# Patient Record
Sex: Female | Born: 1960 | Race: White | Hispanic: No | Marital: Married | Smoking: Never smoker
Health system: Southern US, Community
[De-identification: ages and names within clinical notes are randomized; demographics above are authoritative.]

## PROBLEM LIST (undated history)

## (undated) DIAGNOSIS — E7800 Pure hypercholesterolemia, unspecified: Secondary | ICD-10-CM

## (undated) DIAGNOSIS — S83249A Other tear of medial meniscus, current injury, unspecified knee, initial encounter: Secondary | ICD-10-CM

## (undated) DIAGNOSIS — E785 Hyperlipidemia, unspecified: Secondary | ICD-10-CM

## (undated) DIAGNOSIS — E119 Type 2 diabetes mellitus without complications: Secondary | ICD-10-CM

## (undated) DIAGNOSIS — Z8719 Personal history of other diseases of the digestive system: Secondary | ICD-10-CM

## (undated) DIAGNOSIS — E039 Hypothyroidism, unspecified: Secondary | ICD-10-CM

## (undated) DIAGNOSIS — Z87442 Personal history of urinary calculi: Secondary | ICD-10-CM

## (undated) DIAGNOSIS — I10 Essential (primary) hypertension: Secondary | ICD-10-CM

## (undated) DIAGNOSIS — Z8541 Personal history of malignant neoplasm of cervix uteri: Secondary | ICD-10-CM

## (undated) DIAGNOSIS — E669 Obesity, unspecified: Secondary | ICD-10-CM

## (undated) DIAGNOSIS — T8859XA Other complications of anesthesia, initial encounter: Secondary | ICD-10-CM

## (undated) DIAGNOSIS — K219 Gastro-esophageal reflux disease without esophagitis: Secondary | ICD-10-CM

## (undated) DIAGNOSIS — T4145XA Adverse effect of unspecified anesthetic, initial encounter: Secondary | ICD-10-CM

## (undated) DIAGNOSIS — M1990 Unspecified osteoarthritis, unspecified site: Secondary | ICD-10-CM

## (undated) HISTORY — PX: OTHER SURGICAL HISTORY: SHX169

## (undated) HISTORY — PX: ABDOMINAL HYSTERECTOMY: SHX81

## (undated) HISTORY — DX: Essential (primary) hypertension: I10

## (undated) HISTORY — PX: COLONOSCOPY: SHX174

## (undated) HISTORY — DX: Type 2 diabetes mellitus without complications: E11.9

## (undated) HISTORY — DX: Hyperlipidemia, unspecified: E78.5

---

## 1998-07-28 ENCOUNTER — Emergency Department (HOSPITAL_COMMUNITY): Admission: EM | Admit: 1998-07-28 | Discharge: 1998-07-28 | Payer: Self-pay | Admitting: Internal Medicine

## 1998-07-28 ENCOUNTER — Encounter: Payer: Self-pay | Admitting: Internal Medicine

## 2000-02-28 ENCOUNTER — Ambulatory Visit (HOSPITAL_BASED_OUTPATIENT_CLINIC_OR_DEPARTMENT_OTHER): Admission: RE | Admit: 2000-02-28 | Discharge: 2000-02-28 | Payer: Self-pay | Admitting: Orthopedic Surgery

## 2000-06-20 ENCOUNTER — Encounter (INDEPENDENT_AMBULATORY_CARE_PROVIDER_SITE_OTHER): Payer: Self-pay

## 2000-06-20 ENCOUNTER — Other Ambulatory Visit: Admission: RE | Admit: 2000-06-20 | Discharge: 2000-06-20 | Payer: Self-pay | Admitting: Obstetrics and Gynecology

## 2002-04-05 ENCOUNTER — Encounter: Payer: Self-pay | Admitting: Obstetrics and Gynecology

## 2002-04-05 ENCOUNTER — Encounter: Admission: RE | Admit: 2002-04-05 | Discharge: 2002-04-05 | Payer: Self-pay | Admitting: Obstetrics and Gynecology

## 2003-03-19 DIAGNOSIS — Z8541 Personal history of malignant neoplasm of cervix uteri: Secondary | ICD-10-CM

## 2003-03-19 HISTORY — DX: Personal history of malignant neoplasm of cervix uteri: Z85.41

## 2003-05-03 ENCOUNTER — Encounter: Admission: RE | Admit: 2003-05-03 | Discharge: 2003-05-03 | Payer: Self-pay | Admitting: Obstetrics and Gynecology

## 2004-03-06 ENCOUNTER — Ambulatory Visit (HOSPITAL_BASED_OUTPATIENT_CLINIC_OR_DEPARTMENT_OTHER): Admission: RE | Admit: 2004-03-06 | Discharge: 2004-03-06 | Payer: Self-pay | Admitting: Obstetrics and Gynecology

## 2004-03-06 ENCOUNTER — Encounter (INDEPENDENT_AMBULATORY_CARE_PROVIDER_SITE_OTHER): Payer: Self-pay | Admitting: *Deleted

## 2004-03-06 ENCOUNTER — Ambulatory Visit (HOSPITAL_COMMUNITY): Admission: RE | Admit: 2004-03-06 | Discharge: 2004-03-06 | Payer: Self-pay | Admitting: Obstetrics and Gynecology

## 2004-03-08 ENCOUNTER — Inpatient Hospital Stay (HOSPITAL_COMMUNITY): Admission: RE | Admit: 2004-03-08 | Discharge: 2004-03-10 | Payer: Self-pay | Admitting: Obstetrics and Gynecology

## 2004-03-08 ENCOUNTER — Encounter (INDEPENDENT_AMBULATORY_CARE_PROVIDER_SITE_OTHER): Payer: Self-pay | Admitting: *Deleted

## 2004-05-21 ENCOUNTER — Encounter: Admission: RE | Admit: 2004-05-21 | Discharge: 2004-05-21 | Payer: Self-pay | Admitting: Obstetrics and Gynecology

## 2004-08-18 ENCOUNTER — Emergency Department (HOSPITAL_COMMUNITY): Admission: EM | Admit: 2004-08-18 | Discharge: 2004-08-18 | Payer: Self-pay | Admitting: Emergency Medicine

## 2005-03-18 DIAGNOSIS — Z87442 Personal history of urinary calculi: Secondary | ICD-10-CM

## 2005-03-18 HISTORY — DX: Personal history of urinary calculi: Z87.442

## 2008-06-02 ENCOUNTER — Emergency Department (HOSPITAL_COMMUNITY): Admission: EM | Admit: 2008-06-02 | Discharge: 2008-06-02 | Payer: Self-pay | Admitting: Emergency Medicine

## 2008-12-29 HISTORY — PX: ENDOSCOPIC PLANTAR FASCIOTOMY: SUR443

## 2010-06-28 LAB — URINALYSIS, ROUTINE W REFLEX MICROSCOPIC
Leukocytes, UA: NEGATIVE
Nitrite: NEGATIVE
Protein, ur: NEGATIVE mg/dL
Specific Gravity, Urine: 1.023 (ref 1.005–1.030)
Urobilinogen, UA: 0.2 mg/dL (ref 0.0–1.0)

## 2010-06-28 LAB — D-DIMER, QUANTITATIVE: D-Dimer, Quant: 0.49 ug/mL-FEU — ABNORMAL HIGH (ref 0.00–0.48)

## 2010-06-28 LAB — POCT I-STAT, CHEM 8
Hemoglobin: 14.3 g/dL (ref 12.0–15.0)
Potassium: 4.3 mEq/L (ref 3.5–5.1)
Sodium: 140 mEq/L (ref 135–145)
TCO2: 28 mmol/L (ref 0–100)

## 2010-06-28 LAB — URINE MICROSCOPIC-ADD ON

## 2010-08-03 NOTE — H&P (Signed)
NAME:  Joyce Sharp, Joyce Sharp           ACCOUNT NO.:  [id number]   MEDICAL RECORD NO.:  [id number]          PATIENT TYPE:  INP   LOCATION:  NA                           FACILITY:  [facility]   PHYSICIAN:  Katherine Roan, M.D.  DATE OF BIRTH:  1961-02-11   DATE OF ADMISSION:  DATE OF DISCHARGE:                                HISTORY & PHYSICAL   CHIEF COMPLAINT:  Elective admission.   HISTORY OF PRESENT ILLNESS:  This is a 50 year old, gravida 2, para 2 female  who has colposcopic diagnosis of adenocarcinoma in situ. She is for a cold  knife conization. She has had two deliveries. Her last menstrual period was  February 22, 2004.  Medications include Synthroid, the only comorbidity is  obesity.  She had a normal colposcopy in 2001 because of atypical granular  cells. She has also had a cervical laceration and tracheorrhaphy.   REVIEW OF SYMPTOMS:  HEENT:  She has no decrease in visual or auditory  acuity, no dizziness, no frequent headaches.  HEART:  No hypertension, no  rheumatic fever, no chest pain, no history of mitral valve prolapse.  LUNGS:  No chronic cough, no asthma.  GU:  No stress incontinence or frequency. GI:  No bowel habit change, no melena.  No weight loss or gain.  MUSCLE/BONES/JOINTS:  No fractures or arthritis.   SOCIAL HISTORY:  She works in Clinical biochemist as a Production designer, theatre/television/film.   FAMILY HISTORY:  Her mother is living and well at 26, her father died at 38.  She has three brothers living and well.  Her father died of a myocardial  infarction.  There is no cancer or diabetes in the family.  Because of the  diagnosis of adenocarcinoma in situ, the plan is to do a cold knife  conization and hysterectomy 48 hours later.   PHYSICAL EXAMINATION:  GENERAL:  Reveals a weight of 229, a well-developed,  well-nourished female who appears to be her stated age. She is oriented to  time, place and recent events.  VITAL SIGNS:  Blood pressure 120/80, pulse 80.  HEENT:  Examination of the  ears, nose and throat are unremarkable.  Oropharynx is not injected.  NECK:  Supple. Carotid pulses are equal without bruits.  HEART:  Normal sinus rhythm.  LUNGS:  Clear to P&A.  BREASTS:  No masses or tenderness.  Axilla free from adenopathy.  ABDOMEN:  Obese.  Liver, spleen and kidneys are not palpated. Bowel sounds  are normal.  EXTREMITIES:  Show good range of motion, equal pulses and reflexes.  PELVIC:  Reveals a cervix that is nodular and lacerated.  There is an  irregular area on the cervix that is hyperemic. The uterus is normal size  and shape.   IMPRESSION:  Cervical laceration, adenocarcinoma in situ of the cervix for  cold knife conization and subsequent hysterectomy 48 hours later. The  patient has been apprised of the risks of the procedure and we have  discussed hemorrhage, infection and risk of blood transfusion, hepatitis and  AIDS.  We have discussed damage to bladder, bowel, ureters. She is on a  bowel prep.  SDM/MEDQ  D:  03/05/2004  T:  03/05/2004  Job:  [id number]

## 2010-08-03 NOTE — Op Note (Signed)
[name], Sharp           ACCOUNT NO.:  [id number]   MEDICAL RECORD NO.:  [id number]          PATIENT TYPE:  INP   LOCATION:  0462                         FACILITY:  [facility]   PHYSICIAN:  Katherine Roan, M.D.  DATE OF BIRTH:  10/19/1960   DATE OF PROCEDURE:  03/08/2004  DATE OF DISCHARGE:                                 OPERATIVE REPORT   PREOPERATIVE DIAGNOSES:  Adenocarcinoma in situ.   POSTOPERATIVE DIAGNOSES:  Adenocarcinoma in situ.   OPERATION:  Pelvic examination under anesthesia, total abdominal  hysterectomy.   DESCRIPTION OF PROCEDURE:  The patient was given a general anesthetic,  endotracheal anesthetic and patient was examined prior to prep. The uterus  appeared to be normal size and shape, I felt no adnexal masses. She was then  prepped for a sterile vaginal surgery and a Foley catheter was inserted. A  transverse incision was made in the abdomen and extended in layers to the  peritoneal cavity which was entered vertically. The upper abdomen appeared  to be normal. There were no adhesions. Carefully the abdominal viscera were  then packed away from the pelvic viscera. The uterus was normal size and  shape but it was quite deep. The patient had previous conization. The  cornual aspects of the uterus were elevated, uteroovarian anastomosis and  round ligaments were suture ligated and divided. The uterine arteries were  skeletonized and ligated, bladder flap was pushed off the lower segment. The  patient had a distorted cervix and on the right side there was a cervical  laceration that extended up in the endocervix. We got into the endocervix at  that time so in essence we removed that and then removed the rest of the  gnarled lacerated cervix being very careful with bites around the  paravaginal area and being careful to push the bladder downward.  The vagina  was then sutured with interrupted sutures of #0 Vicryl interrupted sutures  and closed. Hemostasis was  quite secure. I then plicated the peritoneum  posteriorly to avoid enterocele formation. I did not close the pelvic  peritoneum. Following this, hemostasis secured, the parietoperitoneum was  closed with 2-0 PDS, the fascia was closed with 2-0 PDS and interrupted #0  Vicryl.  Scarpa's fascia was closed with a running sutures of 3-0 PDS and  the skin was closed with 4-0 Monocryl.  I infiltrated the fascia with 0.5%  Marcaine with epinephrine. During the procedure the estimated blood loss was  400 mL. The patient was obese and received 400 mL of fluid intraoperative.  About 100 mL of urine was coming out at the end of the surgery.  The patient  tolerated the procedure well and was sent to the recovery room.      SDM/MEDQ  D:  03/08/2004  T:  03/08/2004  Job:  [id number]

## 2010-08-03 NOTE — Discharge Summary (Signed)
NAMELILLIA, LENGEL           ACCOUNT NO.:  [id number]   MEDICAL RECORD NO.:  [id number]          PATIENT TYPE:  INP   LOCATION:  0462                         [facility]ACILITY:  [facility]   PHYSICIAN:  Katherine Roan, M.D.  DATE OF BIRTH:  02-19-1961   DATE OF ADMISSION:  03/08/2004  DATE OF DISCHARGE:  03/10/2004                                 DISCHARGE SUMMARY   ADMISSION DIAGNOSES:  1.  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  2.  Adenomyosis and leiomyoma.   BRIEF HISTORY:  Ms. Alperin is a 50 year old female who has colposcopic  diagnosis of adenocarcinoma in situ.  She had a conization 48 hours prior to  this admission, and adenocarcinoma in situ was noted.  She was therefore  admitted for definitive surgery.   LABORATORY STUDIES:  Hemoglobin on March 07, 2004 was 12, postoperative  hemoglobin was 10, with a white count of 8,900.  Cardiogram was normal.   HOSPITAL COURSE:  The patient was admitted to the hospital and underwent a  pelvic exam under anesthesia, total abdominal hysterectomy.  Her  postoperative course was uncomplicated.  Both ovaries were normal.  She did  very well and was discharged on Christmas Eve.  Her examination revealed her  chest was clear.  Her abdomen was soft.  Bowel sounds were appropriate.  Incision was intact and without erythema or tenderness.  She had no  significant vaginal discharge.  She was discharged and asked to come to the  office in two weeks.  To call for fever, bleeding, or any other difficulty.   FINAL DIAGNOSIS:  Adenocarcinoma in situ.   CONDITION ON DISCHARGE:  Improved.      SDM/MEDQ  D:  03/29/2004  T:  03/29/2004  Job:  [id number]

## 2010-08-03 NOTE — Op Note (Signed)
[place]  Patient:    Joyce Sharp, Joyce Sharp                  MRN: [id number] Proc. Date: 02/29/00 Adm. Date:  [id number] Attending:  Colbert Ewing                           Operative Report  PREOPERATIVE DIAGNOSES: 1. Chronic plantar fasciitis - left heel. 2. Chronic plantar fasciitis - right heel, less severe.  POSTOPERATIVE DIAGNOSES: 1. Chronic plantar fasciitis - left heel. 2. Chronic plantar fasciitis - right heel, less severe.  OPERATIVE PROCEDURE: 1. Endoscopic plantar fascia release - left heel. 2. Injection plantar fascia origin - right heel with Aristospan and Marcaine.  SURGEON:  Loreta Ave, M.D.  ASSISTANT:  Arlys John D. Petrarca, P.A.-C.  ANESTHESIA:  General.  BLOOD LOSS:  Minimal.  SPECIMENS:  None.  CULTURES:  None.  COMPLICATIONS:  None.  DRESSING:  Soft compressive - left foot.  TOURNIQUET TIME:  30 minutes - left foot.  DESCRIPTION OF PROCEDURE:  Patient brought to the operating room, placed on operating table in supine position.  After adequate anesthesia had been obtained, the site of maximum tenderness was injected at the right heel through a plantar approach.  That site being determined prior to anesthesia and also utilizing fluoroscopic guidance to isolate the plantar fascia attachment to the os calcis.  Done under sterile technique with Aristospan and Marcaine.  Bandaid placed.  Attention turned to the left foot.  Tourniquet placed on the calf.  Prepped and draped in usual sterile fashion. Exsanguinated with elevation and Esmarch.  Tourniquet inflated to 250 mmHg. Longitudinal portals made on the medial and lateral side of the plantar fascia and just plantar to this avoiding neurovascular structures.  The ______ spreading instrument was placed on the plantar side of the plantar fascia after this site was determined with fluoroscopic guidance.  The cannula for endoscopic release was placed  across the foot from the medial to lateral incisions just plantar to the plantar fascia and just distal to the os calcis attachment.  Under direct visualization, plantar fascia divided in its entirety with the endoscopic system.  This was confirmed visually throughout. Care taken to avoid neurovascular structures.  Wound irrigated through the cannula.  Cannula removed.  Portals closed with nylon after being injected with Marcaine.  Sterile compressive dressing applied.  Tourniquet deflated and anesthesia reversed, brought to recovery room.  Tolerated surgery well, no complications. DD:  02/29/00 TD:  02/29/00 Job: [id number]/[id number]

## 2010-08-03 NOTE — Op Note (Signed)
NAMEREBECA, VALDIVIA           ACCOUNT NO.:  [id number]   MEDICAL RECORD NO.:  [id number]          PATIENT TYPE:  INP   LOCATION:  NA                           FACILITY:  [facility]   PHYSICIAN:  Katherine Roan, M.D.  DATE OF BIRTH:  July 15, 1960   DATE OF PROCEDURE:  03/06/2004  DATE OF DISCHARGE:                                 OPERATIVE REPORT   PREOPERATIVE DIAGNOSIS:  Adenocarcinoma in situ of endocervix.   POSTOPERATIVE DIAGNOSIS:  Adenocarcinoma in situ of endocervix.   OPERATION:  Cold knife conization.   The patient was placed in lithotomy position and prepped and draped in the  usual fashion.  Exam under anesthesia was performed with a normal-size  uterus without adnexal masses.  Cervix was lacerated from previous delivery  laceration.  Two lateral sutures were placed in the cervix, and the cervix  was injected with 1% Xylocaine with epinephrine.  The endocervical os was  identified, and a cold knife conization was obtained.  I also obtained a  portion of the posterior lip because this had red granulation tissue on its  surface.  An ECC was performed.  I used the ball cautery to cauterize the  endocervix.  There was scant bleeding.  Patty tolerated the procedure well  and was sent to the recovery room in good condition.      SDM/MEDQ  D:  03/06/2004  T:  03/06/2004  Job:  [id number]

## 2011-11-01 ENCOUNTER — Encounter: Payer: Self-pay | Admitting: Gastroenterology

## 2011-12-30 ENCOUNTER — Ambulatory Visit (AMBULATORY_SURGERY_CENTER): Payer: BC Managed Care – PPO | Admitting: *Deleted

## 2011-12-30 VITALS — Ht 63.0 in | Wt 228.0 lb

## 2011-12-30 DIAGNOSIS — Z1211 Encounter for screening for malignant neoplasm of colon: Secondary | ICD-10-CM

## 2011-12-30 MED ORDER — MOVIPREP 100 G PO SOLR: ORAL | Status: DC

## 2011-12-31 ENCOUNTER — Encounter: Payer: Self-pay | Admitting: Gastroenterology

## 2012-01-13 ENCOUNTER — Encounter: Payer: Self-pay | Admitting: Gastroenterology

## 2012-01-13 ENCOUNTER — Ambulatory Visit (AMBULATORY_SURGERY_CENTER): Payer: BC Managed Care – PPO | Admitting: Gastroenterology

## 2012-01-13 VITALS — BP 109/72 | HR 58 | Temp 97.7°F | Resp 18 | Ht 63.0 in | Wt 228.0 lb

## 2012-01-13 DIAGNOSIS — K5730 Diverticulosis of large intestine without perforation or abscess without bleeding: Secondary | ICD-10-CM

## 2012-01-13 DIAGNOSIS — D126 Benign neoplasm of colon, unspecified: Secondary | ICD-10-CM

## 2012-01-13 DIAGNOSIS — Z1211 Encounter for screening for malignant neoplasm of colon: Secondary | ICD-10-CM

## 2012-01-13 DIAGNOSIS — K644 Residual hemorrhoidal skin tags: Secondary | ICD-10-CM

## 2012-01-13 MED ORDER — SODIUM CHLORIDE 0.9 % IV SOLN: 500.0000 mL | INTRAVENOUS | Status: DC

## 2012-01-13 NOTE — Patient Instructions (Addendum)
Discharge instructions given with verbal understanding. Handouts on polyps,diverticulosis and hemorrhoids. Resume previous medications. YOU HAD AN ENDOSCOPIC PROCEDURE TODAY AT THE [facility]: Refer to the procedure report that was given to you for any specific questions about what was found during the examination.  If the procedure report does not answer your questions, please call your gastroenterologist to clarify.  If you requested that your care partner not be given the details of your procedure findings, then the procedure report has been included in a sealed envelope for you to review at your convenience later.  YOU SHOULD EXPECT: Some feelings of bloating in the abdomen. Passage of more gas than usual.  Walking can help get rid of the air that was put into your GI tract during the procedure and reduce the bloating. If you had a lower endoscopy (such as a colonoscopy or flexible sigmoidoscopy) you may notice spotting of blood in your stool or on the toilet paper. If you underwent a bowel prep for your procedure, then you may not have a normal bowel movement for a few days.  DIET: Your first meal following the procedure should be a light meal and then it is ok to progress to your normal diet.  A half-sandwich or bowl of soup is an example of a good first meal.  Heavy or fried foods are harder to digest and may make you feel nauseous or bloated.  Likewise meals heavy in dairy and vegetables can cause extra gas to form and this can also increase the bloating.  Drink plenty of fluids but you should avoid alcoholic beverages for 24 hours.  ACTIVITY: Your care partner should take you home directly after the procedure.  You should plan to take it easy, moving slowly for the rest of the day.  You can resume normal activity the day after the procedure however you should NOT DRIVE or use heavy machinery for 24 hours (because of the sedation medicines used during the test).    SYMPTOMS TO REPORT  IMMEDIATELY: A gastroenterologist can be reached at any hour.  During normal business hours, 8:30 AM to 5:00 PM Monday through Friday, call [phone].  After hours and on weekends, please call the GI answering service at [phone] who will take a message and have the physician on call contact you.   Following lower endoscopy (colonoscopy or flexible sigmoidoscopy):  Excessive amounts of blood in the stool  Significant tenderness or worsening of abdominal pains  Swelling of the abdomen that is new, acute  Fever of 100F or higher  FOLLOW UP: If any biopsies were taken you will be contacted by phone or by letter within the next 1-3 weeks.  Call your gastroenterologist if you have not heard about the biopsies in 3 weeks.  Our staff will call the home number listed on your records the next business day following your procedure to check on you and address any questions or concerns that you may have at that time regarding the information given to you following your procedure. This is a courtesy call and so if there is no answer at the home number and we have not heard from you through the emergency physician on call, we will assume that you have returned to your regular daily activities without incident.  SIGNATURES/CONFIDENTIALITY: You and/or your care partner have signed paperwork which will be entered into your electronic medical record.  These signatures attest to the fact that that the information above on your After Visit Summary   has been reviewed and is understood.  Full responsibility of the confidentiality of this discharge information lies with you and/or your care-partner. 

## 2012-01-13 NOTE — Op Note (Signed)
[facility] [address] [place], [address]   COLONOSCOPY PROCEDURE REPORT  PATIENT: Sharp, Joyce  MR#: [id number] BIRTHDATE: 10-10-1960 , 51  yrs. old GENDER: Female ENDOSCOPIST: Rachael Fee, MD REFERRED QI:ONGEXB Eloise Harman, M.D. PROCEDURE DATE:  01/13/2012 PROCEDURE:   Colonoscopy with snare polypectomy ASA CLASS:   Class III INDICATIONS:average risk screening. MEDICATIONS: Fentanyl 100 mcg IV, Versed 10 mg IV, and These medications were titrated to patient response per physician's verbal order  DESCRIPTION OF PROCEDURE:   After the risks benefits and alternatives of the procedure were thoroughly explained, informed consent was obtained.  A digital rectal exam revealed no abnormalities of the rectum.   The LB [id number]  endoscope was introduced through the anus and advanced to the cecum, which was identified by both the appendix and ileocecal valve. No adverse events experienced.   The quality of the prep was good, using MoviPrep  The instrument was then slowly withdrawn as the colon was fully examined.  COLON FINDINGS: There was left sided, mild diverticulosis.  There was a diminutive polyp in sigmoid colon (sessile, 4mm across) removed with cold snare and sent to pathology.  There were small external hemorrhoids.  The examination was otherwise normal. Retroflexed views revealed no abnormalities. The time to cecum=3 minutes 56 seconds.  Withdrawal time=8 minutes 25 seconds.  The scope was withdrawn and the procedure completed. COMPLICATIONS: There were no complications.  ENDOSCOPIC IMPRESSION: There was left sided, mild diverticulosis. There was a single small polyp; removed and sent to pathology. There were small external hemorrhoids. The examination was otherwise normal.  RECOMMENDATIONS: If the polyp(s) removed today are proven to be adenomatous (pre-cancerous) polyps, you will need a repeat colonoscopy in 5 years.  Otherwise  you should continue to follow colorectal cancer screening guidelines for "routine risk" patients with colonoscopy in 10 years.  You will receive a letter within 1-2 weeks with the results of your biopsy as well as final recommendations.  Please call my office if you have not received a letter after 3 weeks.   eSigned:  Rachael Fee, MD 01/13/2012 8:49 AM

## 2012-01-13 NOTE — Progress Notes (Signed)
Patient did not experience any of the following events: a burn prior to discharge; a fall within the facility; wrong site/side/patient/procedure/implant event; or a hospital transfer or hospital admission upon discharge from the facility. (G8907) Patient did not have preoperative order for IV antibiotic SSI prophylaxis. (G8918)  

## 2012-01-14 ENCOUNTER — Telehealth: Payer: Self-pay | Admitting: *Deleted

## 2012-01-14 NOTE — Telephone Encounter (Signed)
  Follow up Call-  Call back number 01/13/2012  Post procedure Call Back phone  # [phone]  Permission to leave phone message Yes     Patient questions:  Do you have a fever, pain , or abdominal swelling? no Pain Score  0 *  Have you tolerated food without any problems? yes  Have you been able to return to your normal activities?no  Do you have any questions about your discharge instructions: Diet   no Medications  no Follow up visit  no  Do you have questions or concerns about your Care? no  Actions: * If pain score is 4 or above: No action needed, pain <4.  Pt. States she "just feels a little groggy this am and does not really feel like driving Or going back to work"  Denies abdominal pain, or fever.  Encouraged to call office if she feels that she is  Having any problems other than feeling "groggy".

## 2012-01-19 ENCOUNTER — Encounter: Payer: Self-pay | Admitting: Gastroenterology

## 2013-10-06 ENCOUNTER — Other Ambulatory Visit: Payer: Self-pay

## 2013-10-06 DIAGNOSIS — Z1231 Encounter for screening mammogram for malignant neoplasm of breast: Secondary | ICD-10-CM

## 2013-10-27 ENCOUNTER — Ambulatory Visit: Payer: BC Managed Care – PPO

## 2013-10-27 ENCOUNTER — Ambulatory Visit
Admission: RE | Admit: 2013-10-27 | Discharge: 2013-10-27 | Disposition: A | Discharge status: Home / Self Care | Payer: BC Managed Care – PPO | Source: Ambulatory Visit

## 2013-10-27 DIAGNOSIS — Z1231 Encounter for screening mammogram for malignant neoplasm of breast: Secondary | ICD-10-CM

## 2014-04-05 ENCOUNTER — Emergency Department (HOSPITAL_COMMUNITY): Payer: BC Managed Care – PPO

## 2014-04-05 ENCOUNTER — Inpatient Hospital Stay (HOSPITAL_COMMUNITY)
Admission: EM | Admit: 2014-04-05 | Discharge: 2014-04-06 | DRG: 287 | Disposition: A | Discharge status: Home / Self Care | Payer: BC Managed Care – PPO | Attending: Internal Medicine | Admitting: Internal Medicine

## 2014-04-05 ENCOUNTER — Encounter (HOSPITAL_COMMUNITY): Payer: Self-pay | Admitting: *Deleted

## 2014-04-05 DIAGNOSIS — E039 Hypothyroidism, unspecified: Secondary | ICD-10-CM | POA: Diagnosis present

## 2014-04-05 DIAGNOSIS — Z8541 Personal history of malignant neoplasm of cervix uteri: Secondary | ICD-10-CM

## 2014-04-05 DIAGNOSIS — R1010 Upper abdominal pain, unspecified: Secondary | ICD-10-CM

## 2014-04-05 DIAGNOSIS — R740 Nonspecific elevation of levels of transaminase and lactic acid dehydrogenase [LDH]: Secondary | ICD-10-CM

## 2014-04-05 DIAGNOSIS — R079 Chest pain, unspecified: Principal | ICD-10-CM | POA: Diagnosis present

## 2014-04-05 DIAGNOSIS — R7989 Other specified abnormal findings of blood chemistry: Secondary | ICD-10-CM

## 2014-04-05 DIAGNOSIS — Z7952 Long term (current) use of systemic steroids: Secondary | ICD-10-CM

## 2014-04-05 DIAGNOSIS — R7401 Elevation of levels of liver transaminase levels: Secondary | ICD-10-CM | POA: Diagnosis present

## 2014-04-05 DIAGNOSIS — R945 Abnormal results of liver function studies: Secondary | ICD-10-CM

## 2014-04-05 DIAGNOSIS — E780 Pure hypercholesterolemia: Secondary | ICD-10-CM | POA: Diagnosis present

## 2014-04-05 DIAGNOSIS — I209 Angina pectoris, unspecified: Secondary | ICD-10-CM

## 2014-04-05 DIAGNOSIS — R739 Hyperglycemia, unspecified: Secondary | ICD-10-CM | POA: Insufficient documentation

## 2014-04-05 DIAGNOSIS — I10 Essential (primary) hypertension: Secondary | ICD-10-CM | POA: Diagnosis present

## 2014-04-05 DIAGNOSIS — E1165 Type 2 diabetes mellitus with hyperglycemia: Secondary | ICD-10-CM | POA: Diagnosis present

## 2014-04-05 DIAGNOSIS — N179 Acute kidney failure, unspecified: Secondary | ICD-10-CM | POA: Diagnosis present

## 2014-04-05 DIAGNOSIS — Z6841 Body Mass Index (BMI) 40.0 and over, adult: Secondary | ICD-10-CM

## 2014-04-05 DIAGNOSIS — E785 Hyperlipidemia, unspecified: Secondary | ICD-10-CM | POA: Diagnosis present

## 2014-04-05 DIAGNOSIS — E669 Obesity, unspecified: Secondary | ICD-10-CM | POA: Diagnosis present

## 2014-04-05 DIAGNOSIS — Z791 Long term (current) use of non-steroidal anti-inflammatories (NSAID): Secondary | ICD-10-CM

## 2014-04-05 HISTORY — DX: Essential (primary) hypertension: I10

## 2014-04-05 HISTORY — DX: Hypothyroidism, unspecified: E03.9

## 2014-04-05 HISTORY — DX: Pure hypercholesterolemia, unspecified: E78.00

## 2014-04-05 HISTORY — DX: Obesity, unspecified: E66.9

## 2014-04-05 LAB — CBC
HCT: 40.4 % (ref 36.0–46.0)
HEMOGLOBIN: 13.4 g/dL (ref 12.0–15.0)
MCH: 32.1 pg (ref 26.0–34.0)
MCHC: 33.2 g/dL (ref 30.0–36.0)
MCV: 96.7 fL (ref 78.0–100.0)
PLATELETS: 200 10*3/uL (ref 150–400)
RBC: 4.18 MIL/uL (ref 3.87–5.11)
RDW: 14 % (ref 11.5–15.5)
WBC: 7.9 10*3/uL (ref 4.0–10.5)

## 2014-04-05 LAB — CBC WITH DIFFERENTIAL/PLATELET
BASOS ABS: 0 10*3/uL (ref 0.0–0.1)
BASOS PCT: 1 % (ref 0–1)
EOS ABS: 0 10*3/uL (ref 0.0–0.7)
EOS PCT: 0 % (ref 0–5)
HCT: 40.3 % (ref 36.0–46.0)
Hemoglobin: 13.6 g/dL (ref 12.0–15.0)
LYMPHS ABS: 2.2 10*3/uL (ref 0.7–4.0)
LYMPHS PCT: 26 % (ref 12–46)
MCH: 32.2 pg (ref 26.0–34.0)
MCHC: 33.7 g/dL (ref 30.0–36.0)
MCV: 95.3 fL (ref 78.0–100.0)
MONO ABS: 0.7 10*3/uL (ref 0.1–1.0)
MONOS PCT: 9 % (ref 3–12)
NEUTROS PCT: 64 % (ref 43–77)
Neutro Abs: 5.4 10*3/uL (ref 1.7–7.7)
Platelets: 205 10*3/uL (ref 150–400)
RBC: 4.23 MIL/uL (ref 3.87–5.11)
RDW: 13.9 % (ref 11.5–15.5)
WBC: 8.3 10*3/uL (ref 4.0–10.5)

## 2014-04-05 LAB — PROTIME-INR
INR: 1.03 (ref 0.00–1.49)
Prothrombin Time: 13.6 seconds (ref 11.6–15.2)

## 2014-04-05 LAB — COMPREHENSIVE METABOLIC PANEL
ALT: 168 U/L — ABNORMAL HIGH (ref 0–35)
ANION GAP: 10 (ref 5–15)
AST: 100 U/L — AB (ref 0–37)
Albumin: 4.5 g/dL (ref 3.5–5.2)
Alkaline Phosphatase: 74 U/L (ref 39–117)
BILIRUBIN TOTAL: 0.9 mg/dL (ref 0.3–1.2)
BUN: 27 mg/dL — AB (ref 6–23)
CHLORIDE: 104 meq/L (ref 96–112)
CO2: 24 mmol/L (ref 19–32)
CREATININE: 1.19 mg/dL — AB (ref 0.50–1.10)
Calcium: 9.5 mg/dL (ref 8.4–10.5)
GFR calc non Af Amer: 52 mL/min — ABNORMAL LOW (ref >90)
GFR, EST AFRICAN AMERICAN: 60 mL/min — AB (ref >90)
Glucose, Bld: 114 mg/dL — ABNORMAL HIGH (ref 70–99)
Potassium: 3.9 mmol/L (ref 3.5–5.1)
SODIUM: 138 mmol/L (ref 135–145)
TOTAL PROTEIN: 7.6 g/dL (ref 6.0–8.3)

## 2014-04-05 LAB — TROPONIN I
Troponin I: <0.03 ng/mL (ref <0.031)
Troponin I: <0.03 ng/mL (ref <0.031)

## 2014-04-05 LAB — GLUCOSE, CAPILLARY: Glucose-Capillary: 139 mg/dL — ABNORMAL HIGH (ref 70–99)

## 2014-04-05 LAB — TSH: TSH: 1.337 u[IU]/mL (ref 0.350–4.500)

## 2014-04-05 LAB — MAGNESIUM: MAGNESIUM: 2 mg/dL (ref 1.5–2.5)

## 2014-04-05 LAB — CREATININE, SERUM
CREATININE: 1.04 mg/dL (ref 0.50–1.10)
GFR calc Af Amer: 70 mL/min — ABNORMAL LOW (ref >90)
GFR calc non Af Amer: 61 mL/min — ABNORMAL LOW (ref >90)

## 2014-04-05 LAB — LIPASE, BLOOD: Lipase: 28 U/L (ref 11–59)

## 2014-04-05 MED ORDER — ONDANSETRON HCL 4 MG/2ML IJ SOLN: 4.0000 mg | Freq: Four times a day (QID) | INTRAMUSCULAR | Status: DC | PRN

## 2014-04-05 MED ORDER — ASPIRIN EC 81 MG PO TBEC
81.0000 mg | DELAYED_RELEASE_TABLET | Freq: Every day | ORAL | Status: DC
  Filled 2014-04-05: qty 1

## 2014-04-05 MED ORDER — LEVOTHYROXINE SODIUM 100 MCG PO TABS
100.0000 ug | ORAL_TABLET | Freq: Every day | ORAL | Status: DC
  Administered 2014-04-06: 100 ug via ORAL
  Filled 2014-04-05: qty 1

## 2014-04-05 MED ORDER — HYDROCHLOROTHIAZIDE 12.5 MG PO CAPS
12.5000 mg | ORAL_CAPSULE | Freq: Every day | ORAL | Status: DC
  Filled 2014-04-05: qty 1

## 2014-04-05 MED ORDER — IBUPROFEN 200 MG PO TABS
400.0000 mg | ORAL_TABLET | Freq: Four times a day (QID) | ORAL | Status: DC | PRN
  Administered 2014-04-05: 400 mg via ORAL
  Filled 2014-04-05: qty 2

## 2014-04-05 MED ORDER — PREDNISONE 20 MG PO TABS
20.0000 mg | ORAL_TABLET | Freq: Every day | ORAL | Status: DC
  Administered 2014-04-05 – 2014-04-06 (×2): 20 mg via ORAL
  Filled 2014-04-05 (×2): qty 1

## 2014-04-05 MED ORDER — PANTOPRAZOLE SODIUM 40 MG PO TBEC
40.0000 mg | DELAYED_RELEASE_TABLET | Freq: Every day | ORAL | Status: DC
  Administered 2014-04-05 – 2014-04-06 (×2): 40 mg via ORAL
  Filled 2014-04-05 (×2): qty 1

## 2014-04-05 MED ORDER — ENOXAPARIN SODIUM 40 MG/0.4ML ~~LOC~~ SOLN
40.0000 mg | SUBCUTANEOUS | Status: DC
  Administered 2014-04-05: 40 mg via SUBCUTANEOUS
  Filled 2014-04-05 (×2): qty 0.4

## 2014-04-05 MED ORDER — INSULIN ASPART 100 UNIT/ML ~~LOC~~ SOLN: 0.0000 [IU] | Freq: Three times a day (TID) | SUBCUTANEOUS | Status: DC

## 2014-04-05 MED ORDER — SODIUM CHLORIDE 0.9 % IV BOLUS (SEPSIS)
1000.0000 mL | Freq: Once | INTRAVENOUS | Status: AC
  Administered 2014-04-05: 1000 mL via INTRAVENOUS

## 2014-04-05 MED ORDER — SODIUM CHLORIDE 0.9 % IV SOLN: 250.0000 mL | INTRAVENOUS | Status: DC | PRN

## 2014-04-05 MED ORDER — CARVEDILOL 3.125 MG PO TABS
3.1250 mg | ORAL_TABLET | Freq: Two times a day (BID) | ORAL | Status: DC
  Administered 2014-04-05 – 2014-04-06 (×2): 3.125 mg via ORAL
  Filled 2014-04-05 (×2): qty 1

## 2014-04-05 MED ORDER — INSULIN ASPART 100 UNIT/ML ~~LOC~~ SOLN: 0.0000 [IU] | Freq: Every day | SUBCUTANEOUS | Status: DC

## 2014-04-05 MED ORDER — SODIUM CHLORIDE 0.9 % IJ SOLN: 3.0000 mL | Freq: Two times a day (BID) | INTRAMUSCULAR | Status: DC

## 2014-04-05 MED ORDER — LOSARTAN POTASSIUM 50 MG PO TABS
100.0000 mg | ORAL_TABLET | Freq: Every day | ORAL | Status: DC
  Filled 2014-04-05: qty 2

## 2014-04-05 MED ORDER — SODIUM CHLORIDE 0.9 % IV SOLN
1.0000 mL/kg/h | INTRAVENOUS | Status: DC
  Administered 2014-04-05 – 2014-04-06 (×2): 1 mL/kg/h via INTRAVENOUS

## 2014-04-05 MED ORDER — NITROGLYCERIN 0.4 MG SL SUBL: 0.4000 mg | SUBLINGUAL_TABLET | SUBLINGUAL | Status: DC | PRN

## 2014-04-05 MED ORDER — SODIUM CHLORIDE 0.9 % IJ SOLN: 3.0000 mL | INTRAMUSCULAR | Status: DC | PRN

## 2014-04-05 MED ORDER — LOSARTAN POTASSIUM 50 MG PO TABS: 100.0000 mg | ORAL_TABLET | Freq: Every day | ORAL | Status: DC

## 2014-04-05 MED ORDER — LOSARTAN POTASSIUM-HCTZ 100-12.5 MG PO TABS: 1.0000 | ORAL_TABLET | Freq: Every day | ORAL | Status: DC

## 2014-04-05 MED ORDER — ASPIRIN 81 MG PO CHEW
324.0000 mg | CHEWABLE_TABLET | Freq: Once | ORAL | Status: AC
  Administered 2014-04-05: 324 mg via ORAL
  Filled 2014-04-05: qty 4

## 2014-04-05 MED ORDER — SODIUM CHLORIDE 0.9 % IV SOLN
INTRAVENOUS | Status: AC
  Administered 2014-04-05: 16:00:00 via INTRAVENOUS
  Administered 2014-04-05: 75 mL/h via INTRAVENOUS

## 2014-04-05 MED ORDER — SODIUM CHLORIDE 0.9 % IV SOLN: 1.0000 mL/kg/h | INTRAVENOUS | Status: DC

## 2014-04-05 MED ORDER — LORATADINE 10 MG PO TABS
10.0000 mg | ORAL_TABLET | Freq: Every day | ORAL | Status: DC
  Administered 2014-04-05 – 2014-04-06 (×2): 10 mg via ORAL
  Filled 2014-04-05 (×2): qty 1

## 2014-04-05 MED ORDER — FAMOTIDINE 20 MG PO TABS
20.0000 mg | ORAL_TABLET | Freq: Once | ORAL | Status: AC
  Administered 2014-04-05: 20 mg via ORAL
  Filled 2014-04-05: qty 1

## 2014-04-05 MED ORDER — ASPIRIN 81 MG PO CHEW
81.0000 mg | CHEWABLE_TABLET | ORAL | Status: AC
  Administered 2014-04-06: 81 mg via ORAL
  Filled 2014-04-05: qty 1

## 2014-04-05 NOTE — ED Notes (Signed)
Patient transported to X-ray 

## 2014-04-05 NOTE — ED Notes (Signed)
Dr Zavitz at bedside  

## 2014-04-05 NOTE — ED Notes (Signed)
Pt back from x-ray.

## 2014-04-05 NOTE — ED Provider Notes (Signed)
CSN: [id number]     Arrival date & time 04/05/14  0708 History   First MD Initiated Contact with Patient 04/05/14 [phone]     Chief Complaint  Patient presents with  . Chest Pain  . Abdominal Pain     (Consider location/radiation/quality/duration/timing/severity/associated sxs/prior Treatment) HPI Comments: 54 year old female with history of high blood pressure, lipids, cholesterol, hysterectomy for cervical cancer no active treatment, family history of cardiac presents with intermittent epigastric and lower chest tightness and pain the past 3 weeks. At times worse after eating. Patient has mild shortness of breath during the event and dizziness/lightheadedness. Patient has stress test/cardiology appointment for Thursday. Patient says at times it is with exertion and also random. Non radiating. Patient feels lower chest pain and upper abdominal tightness usually occurs together. At times worse after eating fatty foods. No known gallbladder disease. No known ulcer or reflux history. Last episode at 3 am lasting 1 hr  Patient is a 54 y.o. female presenting with chest pain and abdominal pain. The history is provided by the patient.  Chest Pain Associated symptoms: abdominal pain and nausea   Associated symptoms: no back pain, no fever, no headache and not vomiting   Abdominal Pain Associated symptoms: chest pain and nausea   Associated symptoms: no chills, no dysuria, no fever and no vomiting     Past Medical History  Diagnosis Date  . Hyperlipidemia   . Hypertension   . Thyroid disease     hypo  . Cancer 2005    cervical cancer  . Hypercholesteremia   . HTN (hypertension) 04/05/2014  . Obesity 04/05/2014  . Hypothyroidism 04/05/2014   Past Surgical History  Procedure Laterality Date  . Abdominal hysterectomy  2005   Family History  Problem Relation Age of Onset  . Heart disease Father   . Hypertension Mother   . Hyperlipidemia Mother   . CAD Brother    History  Substance Use  Topics  . Smoking status: Never Smoker   . Smokeless tobacco: Never Used  . Alcohol Use: No   OB History    No data available     Review of Systems  Constitutional: Negative for fever and chills.  HENT: Negative for congestion.   Eyes: Negative for visual disturbance.  Respiratory: Positive for chest tightness.   Cardiovascular: Positive for chest pain.  Gastrointestinal: Positive for nausea and abdominal pain. Negative for vomiting and blood in stool.  Genitourinary: Negative for dysuria and flank pain.  Musculoskeletal: Negative for back pain, neck pain and neck stiffness.  Skin: Negative for rash.  Neurological: Positive for light-headedness. Negative for headaches.      Allergies  Invokana  Home Medications   Prior to Admission medications   Medication Sig Start Date End Date Taking? Authorizing Provider  acetaminophen (TYLENOL) 500 MG tablet Take 1,000 mg by mouth every 6 (six) hours as needed for mild pain or headache.   Yes Historical Provider, MD  influenza vac recombinant HA trivalent (FLUBLOK) injection Inject 0.5 mLs into the muscle once.   Yes Historical Provider, MD  levothyroxine (SYNTHROID, LEVOTHROID) 100 MCG tablet Take 100 mcg by mouth daily before breakfast.   Yes Historical Provider, MD  loratadine (CLARITIN) 10 MG tablet Take 10 mg by mouth daily. 03/31/14  Yes Historical Provider, MD  losartan-hydrochlorothiazide (HYZAAR) 100-12.5 MG per tablet Take 1 tablet by mouth daily with breakfast.   Yes Historical Provider, MD  meloxicam (MOBIC) 15 MG tablet Take 15 mg by mouth daily as needed.  Yes Historical Provider, MD  predniSONE (DELTASONE) 20 MG tablet Take 20 mg by mouth daily. 04/01/14 04/11/14 Yes Historical Provider, MD  simvastatin (ZOCOR) 40 MG tablet Take 40 mg by mouth daily with breakfast.   Yes Historical Provider, MD   BP 116/66 mmHg  Pulse 57  Temp(Src) 97.8 F (36.6 C) (Oral)  Resp 14  SpO2 99% Physical Exam  Constitutional: She is  oriented to person, place, and time. She appears well-developed and well-nourished.  HENT:  Head: Normocephalic and atraumatic.  Eyes: Conjunctivae are normal. Right eye exhibits no discharge. Left eye exhibits no discharge.  Neck: Normal range of motion. Neck supple. No tracheal deviation present.  Cardiovascular: Normal rate, regular rhythm and intact distal pulses.   No murmur heard. Pulmonary/Chest: Effort normal and breath sounds normal.  Abdominal: Soft. She exhibits no distension. There is tenderness (very mild upper abdomen). There is no guarding.  Musculoskeletal: She exhibits no edema.  Neurological: She is alert and oriented to person, place, and time.  Skin: Skin is warm. No rash noted.  Psychiatric: She has a normal mood and affect.  Nursing note and vitals reviewed.   ED Course  Procedures (including critical care time)  EMERGENCY DEPARTMENT BILIARY ULTRASOUND INTERPRETATION "Study: Limited Abdominal Ultrasound of the gallbladder and common bile duct."  INDICATIONS: Abdominal pain Indication: Multiple views of the gallbladder and common bile duct were obtained in real-time with a Multi-frequency probe." PERFORMED BY:  Myself IMAGES ARCHIVED?: Yes FINDINGS: Gallbladder wall normal in thickness and Sonographic Murphy's sign absent LIMITATIONS: Body Habitus INTERPRETATION: No signs of cholecystitis, no wall thickening however patient is very mild shadowing at the base of the neck difficult to tell if edge artifact versus small gallstone.   Labs Review Labs Reviewed  COMPREHENSIVE METABOLIC PANEL - Abnormal; Notable for the following:    Glucose, Bld 114 (*)    BUN 27 (*)    Creatinine, Ser 1.19 (*)    AST 100 (*)    ALT 168 (*)    GFR calc non Af Amer 52 (*)    GFR calc Af Amer 60 (*)    All other components within normal limits  CBC WITH DIFFERENTIAL  LIPASE, BLOOD  TROPONIN I  TROPONIN I  TROPONIN I  TROPONIN I  TROPONIN I    Imaging Review Dg Chest 2  View  04/05/2014   CLINICAL DATA:  Mid chest pain and shortness of breath.  EXAM: CHEST  2 VIEW  COMPARISON:  06/02/2008  FINDINGS: The heart size and pulmonary vascularity are normal and the lungs are clear. Very has either a prominent right pericardial fat pad or a pericardial cyst but this is unchanged.  No effusions.  No osseous abnormality.  IMPRESSION: No active cardiopulmonary disease.   Electronically Signed   By: Rozetta Nunnery M.D.   On: 04/05/2014 08:07     EKG Interpretation   Date/Time:  Tuesday April 05 2014 07:20:15 EST Ventricular Rate:  61 PR Interval:  165 QRS Duration: 105 QT Interval:  402 QTC Calculation: 405 R Axis:   31 Text Interpretation:  Sinus rhythm Low voltage, precordial leads No acute  findings. Similar to previous Confirmed by Leyna Vanderkolk  MD, Nadiya Pieratt ([id number]) on  04/05/2014 7:46:19 AM      MDM   Final diagnoses:  Upper abdominal pain  Acute chest pain  LFT elevation  Acute renal failure, unspecified acute renal failure type   Patient presents with recurrent lower central chest and upper abdominal discomfort. At times  worse after eating fatty foods. Patient has multiple risk factors for cardiac and also risk factors for gallbladder disease. Bedside ultrasound showed no significant stones, no wall thickening however patient did have small shadowing at the gallbladder neck possibly edge artifact versus small stones. Formal ultrasound ordered for more detail. Aspirin and pain medicines ordered. Blood work pending. Chest x-ray pending. Differential diagnosis includes gallbladder disease, reflux/ulcer/hernia, pancreas, cardiac disease, other. EKG has no acute ischemic findings reviewed.  Patient symptoms controlled in ER. Discussed with hospitalist for admission, recommended calling primary Dr. prior to accepting. Discussed with primary Dr. on call who recommended tried hospitalist admit for further workup/stress test.  The patients results and plan were reviewed  and discussed.   Any x-rays performed were personally reviewed by myself.   Differential diagnosis were considered with the presenting HPI.  Medications  aspirin chewable tablet 324 mg (324 mg Oral Given 04/05/14 0738)  famotidine (PEPCID) tablet 20 mg (20 mg Oral Given 04/05/14 0800)    Filed Vitals:   04/05/14 1033 04/05/14 1106 04/05/14 1119 04/05/14 1200  BP: 133/59 129/89  116/66  Pulse: 54 62  57  Temp:   97.8 F (36.6 C)   TempSrc:      Resp: 19 18  14   SpO2: 100% 98%  99%    Final diagnoses:  Upper abdominal pain  Acute chest pain  LFT elevation  Acute renal failure, unspecified acute renal failure type    Admission/ observation were discussed with the admitting physician, patient and/or family and they are comfortable with the plan.      Mariea Clonts, MD 04/05/14 [phone]

## 2014-04-05 NOTE — ED Notes (Signed)
20 min timer stared and nurses notified...klj

## 2014-04-05 NOTE — Care Management Note (Addendum)
    Page 1 of 1   04/06/2014     1:46:35 PM CARE MANAGEMENT NOTE 04/06/2014  Patient:  Joyce Sharp, Joyce Sharp   Account Number:  [id number]  Date Initiated:  04/05/2014  Documentation initiated by:  Dessa Phi  Subjective/Objective Assessment:   54 y/o f admitted w/chest pain.     Action/Plan:   From home.   Anticipated DC Date:  04/07/2014   Anticipated DC Plan:  [place]  CM consult      Choice offered to / List presented to:             Status of service:  In process, will continue to follow Medicare Important Message given?   (If response is "NO", the following Medicare IM given date fields will be blank) Date Medicare IM given:   Medicare IM given by:   Date Additional Medicare IM given:   Additional Medicare IM given by:    Discharge Disposition:    Per UR Regulation:  Reviewed for med. necessity/level of care/duration of stay  If discussed at [place] of Stay Meetings, dates discussed:    Comments:  04/06/14 Dessa Phi RN BSN NCM [phone] Cardiac cath today.  04/05/14 Dessa Phi RN BSN NCM [phone] No anticipated d/c needs.

## 2014-04-05 NOTE — H&P (Signed)
[facility] History and Physical  Joyce Sharp VHQ:[id number] DOB: 01/25/1961 DOA: 04/05/2014  Referring physician: Dr. Reather Converse PCP: Donnajean Lopes, MD   Chief Complaint: Chest pain  HPI: Joyce Sharp is a 54 y.o. female  With history of hypertension, hyperlipidemia, hypothyroidism, obesity, family history of coronary artery disease with father dying from an acute MI age 48, 2 brothers 1 with a stent in 1 status post balloon angioplasty in the ages of 27s to 60s who presents to the ED with a 3 week history of intermittent substernal chest pain. Patient states she was awoken up on the morning of admission around 3 AM with midsternal chest pain which she describes as a pressure, with tightness nonradiating lasting about an hour and easing off after sitting up. Patient states has had some associated shortness of breath, nausea, diaphoresis and generalized weakness with these symptoms. Patient states chest pressure can occur on exertion and is relieved with rest. Patient stated occurred while unloading the dishwasher when she was bending over and described as a pressure. Patient denies any emesis. No fever, no chills, no diarrhea, no constipation, no dysuria. Patient denies any melena, no hematemesis, no hematochezia. Patient denies any recent surgeries, no recent long travels, no use of oral contraceptives. Patient states was placed on prednisone recently and Claritin secondary to an allergic reaction to invokna which was started per her PCP for slightly elevated blood sugars. Patient was seen in the ED and initial set of troponin was negative. Comprehensive metabolic profile done had a BUN of 27 creatinine of 1.19 AST of 100 ALT of 168 otherwise was within normal limits. Lipase levels was 28. CBC was unremarkable. Abdominal ultrasound which was done showed a echodense liver suggesting fatty infiltration otherwise unremarkable. EKG showed normal sinus rhythm with low voltage. Patient  was given a full dose aspirin. [facility] were consulted to admit the patient for further evaluation and management.   Review of Systems: As per history of present illness otherwise negative. Constitutional:  No weight loss, night sweats, Fevers, chills, fatigue.  HEENT:  No headaches, Difficulty swallowing,Tooth/dental problems,Sore throat,  No sneezing, itching, ear ache, nasal congestion, post nasal drip,  Cardio-vascular:  No chest pain, Orthopnea, PND, swelling in lower extremities, anasarca, dizziness, palpitations  GI:  No heartburn, indigestion, abdominal pain, nausea, vomiting, diarrhea, change in bowel habits, loss of appetite  Resp:  No shortness of breath with exertion or at rest. No excess mucus, no productive cough, No non-productive cough, No coughing up of blood.No change in color of mucus.No wheezing.No chest wall deformity  Skin:  no rash or lesions.  GU:  no dysuria, change in color of urine, no urgency or frequency. No flank pain.  Musculoskeletal:  No joint pain or swelling. No decreased range of motion. No back pain.  Psych:  No change in mood or affect. No depression or anxiety. No memory loss.   Past Medical History  Diagnosis Date  . Hyperlipidemia   . Hypertension   . Thyroid disease     hypo  . Cancer 2005    cervical cancer  . Hypercholesteremia   . HTN (hypertension) 04/05/2014  . Obesity 04/05/2014  . Hypothyroidism 04/05/2014   Past Surgical History  Procedure Laterality Date  . Abdominal hysterectomy  2005   Social History:  reports that she has never smoked. She has never used smokeless tobacco. She reports that she does not drink alcohol or use illicit drugs.  Allergies  Allergen Reactions  . Invokana [Canagliflozin]  Rash and Other (See Comments)    Dizzy spells     Family History  Problem Relation Age of Onset  . Heart disease Father   . Hypertension Mother   . Hyperlipidemia Mother   . CAD Brother      Prior to  Admission medications   Medication Sig Start Date End Date Taking? Authorizing Provider  acetaminophen (TYLENOL) 500 MG tablet Take 1,000 mg by mouth every 6 (six) hours as needed for mild pain or headache.   Yes Historical Provider, MD  influenza vac recombinant HA trivalent (FLUBLOK) injection Inject 0.5 mLs into the muscle once.   Yes Historical Provider, MD  levothyroxine (SYNTHROID, LEVOTHROID) 100 MCG tablet Take 100 mcg by mouth daily before breakfast.   Yes Historical Provider, MD  loratadine (CLARITIN) 10 MG tablet Take 10 mg by mouth daily. 03/31/14  Yes Historical Provider, MD  losartan-hydrochlorothiazide (HYZAAR) 100-12.5 MG per tablet Take 1 tablet by mouth daily with breakfast.   Yes Historical Provider, MD  meloxicam (MOBIC) 15 MG tablet Take 15 mg by mouth daily as needed.    Yes Historical Provider, MD  predniSONE (DELTASONE) 20 MG tablet Take 20 mg by mouth daily. 04/01/14 04/11/14 Yes Historical Provider, MD  simvastatin (ZOCOR) 40 MG tablet Take 40 mg by mouth daily with breakfast.   Yes Historical Provider, MD   Physical Exam: Filed Vitals:   04/05/14 1119 04/05/14 1200 04/05/14 1313 04/05/14 1348  BP:  116/66 141/66   Pulse:  57 63   Temp: 97.8 F (36.6 C)     TempSrc:      Resp:  14 15   Height:    5\' 3"  (1.6 m)  Weight:    102.967 kg (227 lb)  SpO2:  99% 100%     Wt Readings from Last 3 Encounters:  04/05/14 102.967 kg (227 lb)  01/13/12 103.42 kg (228 lb)  12/30/11 103.42 kg (228 lb)    General:  Obese well-developed well-nourished female in no acute cardiopulmonary distress. Speaking in full sentences.  Eyes: PERRLA, EOMI, normal lids, irises & conjunctiva ENT: grossly normal hearing, lips & tongue Neck: no LAD, masses or thyromegaly Cardiovascular: RRR, no m/r/g. No LE edema. Telemetry: Normal sinus rhythm. No arrhythmias. Respiratory: CTA bilaterally, no w/r/r. Normal respiratory effort. Abdomen: soft, ntnd, positive bowel sounds, no rebound, no  guarding Skin: no rash or induration seen on limited exam Musculoskeletal: grossly normal tone BUE/BLE Psychiatric: grossly normal mood and affect, speech fluent and appropriate Neurologic: Alert and oriented 3. Cranial nerves II through XII are grossly intact. No focal deficits.           Labs on Admission:  Basic Metabolic Panel:  Recent Labs Lab 04/05/14 0747 04/05/14 1405  NA 138  --   K 3.9  --   CL 104  --   CO2 24  --   GLUCOSE 114*  --   BUN 27*  --   CREATININE 1.19* 1.04  CALCIUM 9.5  --   MG  --  2.0   Liver Function Tests:  Recent Labs Lab 04/05/14 0747  AST 100*  ALT 168*  ALKPHOS 74  BILITOT 0.9  PROT 7.6  ALBUMIN 4.5    Recent Labs Lab 04/05/14 0747  LIPASE 28   No results for input(s): AMMONIA in the last 168 hours. CBC:  Recent Labs Lab 04/05/14 0747 04/05/14 1405  WBC 8.3 7.9  NEUTROABS 5.4  --   HGB 13.6 13.4  HCT 40.3 40.4  MCV 95.3 96.7  PLT 205 200   Cardiac Enzymes:  Recent Labs Lab 04/05/14 0747 04/05/14 1300  TROPONINI <0.03 <0.03    BNP (last 3 results) No results for input(s): PROBNP in the last 8760 hours. CBG: No results for input(s): GLUCAP in the last 168 hours.  Radiological Exams on Admission: Dg Chest 2 View  04/05/2014   CLINICAL DATA:  Mid chest pain and shortness of breath.  EXAM: CHEST  2 VIEW  COMPARISON:  06/02/2008  FINDINGS: The heart size and pulmonary vascularity are normal and the lungs are clear. Very has either a prominent right pericardial fat pad or a pericardial cyst but this is unchanged.  No effusions.  No osseous abnormality.  IMPRESSION: No active cardiopulmonary disease.   Electronically Signed   By: Rozetta Nunnery M.D.   On: 04/05/2014 08:07   US Abdomen Complete  04/05/2014   CLINICAL DATA:  Abdominal pain.  EXAM: ULTRASOUND ABDOMEN COMPLETE  COMPARISON:  CT 10/18/2004.  FINDINGS: Gallbladder: No gallstones or wall thickening visualized. No sonographic Murphy sign noted.  Common bile  duct: Diameter: 4 mm  Liver: Liver is slightly echodense suggesting fatty infiltration.  IVC: No abnormality visualized.  Pancreas: Visualized portion unremarkable.  Spleen: Size and appearance within normal limits.  Right Kidney: Length: 10.6 cm. Echogenicity within normal limits. No mass or hydronephrosis visualized.  Left Kidney: Length: 10.6 cm. Echogenicity within normal limits. No mass or hydronephrosis visualized.  Abdominal aorta: No aneurysm visualized.  Other findings: None.  IMPRESSION: Echo dense liver suggesting fatty infiltration. Exam is otherwise unremarkable.   Electronically Signed   By: Marcello Moores  Register   On: 04/05/2014 09:24    EKG: Independently reviewed. Normal sinus rhythm. No ischemic changes noted.  Assessment/Plan Principal Problem:   Acute chest pain Active Problems:   HTN (hypertension)   Hyperlipidemia   Obesity   Transaminitis   Hypothyroidism   Chest pain   #1 acute chest pain Patient with multiple risk factors of hypertension, hyperlipidemia, obesity, strong family history of coronary artery disease presented to the ED with intermittent episodes of substernal chest pain described as a pressure. Initial set of troponins negative. Will admit patient to telemetry. Cycle cardiac enzymes every 6 hours 3. Check a fasting lipid panel. Check a TSH. Check a hemoglobin A1c. Check a 2-D echo. Will start patient on aspirin, beta blocker, prophylactic dose Lovenox. Will hold patient's thiamine secondary to elevated LFTs. Have consulted with cardiology who is recommending a cardiac catheterization tomorrow. Will make patient nothing by mouth after midnight. Follow.  #2 hypertension Stable. Continue home regimen of losartan/HCTZ. Low dose beta blocker has been started as well. Follow.  #3 hyperlipidemia Check a fasting lipid panel. Patient does have a elevated transaminitis and a such will hold statin. Check an acute hepatitis panel. Abdominal ultrasound done consistent with  fatty liver. Follow LFTs.  #4 transaminitis Likely secondary to fatty liver as noted on abdominal ultrasound. Will hold statin for now. Check an acute hepatitis panel. Follow.  #5 hypothyroidism Check a TSH. Continue home dose Synthroid.  #6 obesity  #7 recent allergic reaction to invokana Continue home regimen of prednisone and Claritin.  #8 history of hyperglycemia Check a hemoglobin A1c. Check CBGs before meals and at bedtime. Patient also currently on prednisone. Will place on a sliding scale insulin.  #9 prophylaxis PPI for GI. Lovenox for DVT.  Code Status: Full DVT Prophylaxis: Lovenox Family Communication: Updated patient and husband at bedside. Disposition Plan: Admit to telemetry.  Time spent: 47 minutes  Raven Harmes M.D. [facility] Pager [phone]

## 2014-04-05 NOTE — ED Notes (Signed)
Pt reports central chest pain and epigastric pain x3 weeks - pt has seen her PCP for this and is scheduled to see a cardiologist on Thursday. Pt also admits to abd pain/cramping after eating. Pt describes pain in chest as tightness, admits to shortness of breath, dizziness and lightheadedness.

## 2014-04-05 NOTE — ED Notes (Signed)
Pt to xray

## 2014-04-05 NOTE — ED Notes (Signed)
Belongings sent with patient and spouse.

## 2014-04-05 NOTE — Consult Note (Signed)
Reason for Consult: Chest pain  Requesting Physician: Dr. Grandville Silos  HPI: This holiday is a 54 year old moderately overweight married Caucasian female mother to children who works as a [employer] fundraiser. Primary care physician is Dr. Bevelyn Buckles. We are asked to see her for evaluation of  New-onset chest pain. Her structures include family history with a father who died at age 79 of a myocardial infarction into brothers have had ischemic heart disease and intervention. She is a newly diagnosed non-insulin-dependent requiring diabetic. She has a history of treated hypertension and hyperlipidemia.she has never had a heart attack or stroke. She has had a partial hysterectomy. Approximately 3 weeks ago she had new onset chest and epigastric pain. This has been waxing and waning. It is not associated with activity food or position. There is no history of reflux or peptic ulcer disease. There is associated left shoulder discomfort along with shortness of breath. She was admitted today for evaluation of these symptoms. Abdominal ultrasound showed no evidence of cholecystitis although her liver enzymes are mildly elevated. There was a comment of "fatty liver". She is on a statin drug. Her EKG shows no acute changes. Her enzymes are negative.   Problem List: Patient Active Problem List   Diagnosis Date Noted  . Acute chest pain 04/05/2014  . HTN (hypertension) 04/05/2014  . Hyperlipidemia 04/05/2014  . Obesity 04/05/2014  . Transaminitis 04/05/2014  . Hypothyroidism 04/05/2014  . Chest pain 04/05/2014    PMHx:  Past Medical History  Diagnosis Date  . Hyperlipidemia   . Hypertension   . Thyroid disease     hypo  . Cancer 2005    cervical cancer  . Hypercholesteremia   . HTN (hypertension) 04/05/2014  . Obesity 04/05/2014  . Hypothyroidism 04/05/2014   Past Surgical History  Procedure Laterality Date  . Abdominal hysterectomy  2005    FAMHx: Family History  Problem Relation Age  of Onset  . Heart disease Father   . Hypertension Mother   . Hyperlipidemia Mother   . CAD Brother     SOCHx:  reports that she has never smoked. She has never used smokeless tobacco. She reports that she does not drink alcohol or use illicit drugs.  ALLERGIES: Allergies  Allergen Reactions  . Invokana [Canagliflozin] Rash and Other (See Comments)    Dizzy spells     ROS: Pertinent items are noted in HPI.  HOME MEDICATIONS: Prescriptions prior to admission  Medication Sig Dispense Refill Last Dose  . acetaminophen (TYLENOL) 500 MG tablet Take 1,000 mg by mouth every 6 (six) hours as needed for mild pain or headache.   Past Week at Unknown time  . influenza vac recombinant HA trivalent (FLUBLOK) injection Inject 0.5 mLs into the muscle once.   03/07/2014  . levothyroxine (SYNTHROID, LEVOTHROID) 100 MCG tablet Take 100 mcg by mouth daily before breakfast.   04/04/2014 at Unknown time  . loratadine (CLARITIN) 10 MG tablet Take 10 mg by mouth daily.   04/04/2014 at Unknown time  . losartan-hydrochlorothiazide (HYZAAR) 100-12.5 MG per tablet Take 1 tablet by mouth daily with breakfast.   04/04/2014 at Unknown time  . meloxicam (MOBIC) 15 MG tablet Take 15 mg by mouth daily as needed.    04/04/2014 at Unknown time  . predniSONE (DELTASONE) 20 MG tablet Take 20 mg by mouth daily.   04/04/2014 at Unknown time  . simvastatin (ZOCOR) 40 MG tablet Take 40 mg by mouth daily with breakfast.   04/04/2014 at Unknown  time    HOSPITAL MEDICATIONS: I have reviewed the patient's current medications.  VITALS: Blood pressure 141/66, pulse 63, temperature 97.8 F (36.6 C), temperature source Oral, resp. rate 15, height 5\' 3"  (1.6 m), weight 227 lb (102.967 kg), SpO2 100 %.  INPUT/OUTPUT        PHYSICAL EXAM: General appearance: alert and no distress Neck: no adenopathy, no carotid bruit, no JVD, supple, symmetrical, trachea midline and thyroid not enlarged, symmetric, no  tenderness/mass/nodules Lungs: clear to auscultation bilaterally Heart: regular rate and rhythm, S1, S2 normal, no murmur, click, rub or gallop Extremities: extremities normal, atraumatic, no cyanosis or edema  LABS:  BMP  Recent Labs  04/05/14 0747  NA 138  K 3.9  CL 104  CO2 24  GLUCOSE 114*  BUN 27*  CREATININE 1.19*  CALCIUM 9.5  GFRNONAA 52*  GFRAA 60*    CBC  Recent Labs Lab 04/05/14 1405  WBC 7.9  RBC 4.18  HGB 13.4  HCT 40.4  PLT 200  MCV 96.7    HEMOGLOBIN A1C No results found for: HGBA1C, MPG  Cardiac Panel (last 3 results)  Recent Labs  04/05/14 0747 04/05/14 1300  TROPONINI <0.03 <0.03    BNP (last 3 results) No results for input(s): PROBNP in the last 8760 hours.  TSH No results for input(s): TSH in the last 8760 hours.  CHOLESTEROL No results for input(s): CHOL in the last 8760 hours.  Hepatic Function Panel  Recent Labs  04/05/14 0747  PROT 7.6  ALBUMIN 4.5  AST 100*  ALT 168*  ALKPHOS 74  BILITOT 0.9    IMAGING: Dg Chest 2 View  04/05/2014   CLINICAL DATA:  Mid chest pain and shortness of breath.  EXAM: CHEST  2 VIEW  COMPARISON:  06/02/2008  FINDINGS: The heart size and pulmonary vascularity are normal and the lungs are clear. Very has either a prominent right pericardial fat pad or a pericardial cyst but this is unchanged.  No effusions.  No osseous abnormality.  IMPRESSION: No active cardiopulmonary disease.   Electronically Signed   By: Rozetta Nunnery M.D.   On: 04/05/2014 08:07   EKG- NSR w/o STTWC  IMPRESSION: 1. Chest pain-symptoms worrisome for ischemic heart disease especially in light of her multiple cardiac risk factors. She has been started on a baby aspirin her beta blocker. She is getting DVT prophylaxis Lovenox. I think the best option would be to proceed with cardiac catheterization tomorrow via the right radial approach. 2. Hypertension-controlled on losartan/hydrochlorothiazide 3. Hyperlipidemia-on statin  drug this was discontinued because of her elevated LFTs 4. Non-insulin-requiring diabetes   RECOMMENDATION: 1. Plans will be to transfer to [facility] tomorrow for elective diagnostic coronary arteriography. The procedure has been explained to the patient including risks and benefits.  Time Spent Directly with Patient: 30 minutes  Sharran Caratachea J 04/05/2014, 2:50 PM

## 2014-04-05 NOTE — ED Notes (Signed)
Patient denies chest pain at this time 

## 2014-04-06 ENCOUNTER — Encounter (HOSPITAL_COMMUNITY): Payer: Self-pay | Admitting: Cardiovascular Disease

## 2014-04-06 ENCOUNTER — Encounter (HOSPITAL_COMMUNITY)
Admission: EM | Disposition: A | Discharge status: Home / Self Care | Payer: BC Managed Care – PPO | Source: Home / Self Care | Attending: Internal Medicine

## 2014-04-06 DIAGNOSIS — N179 Acute kidney failure, unspecified: Secondary | ICD-10-CM | POA: Diagnosis present

## 2014-04-06 DIAGNOSIS — E780 Pure hypercholesterolemia: Secondary | ICD-10-CM | POA: Diagnosis present

## 2014-04-06 DIAGNOSIS — Z8541 Personal history of malignant neoplasm of cervix uteri: Secondary | ICD-10-CM | POA: Diagnosis not present

## 2014-04-06 DIAGNOSIS — E1165 Type 2 diabetes mellitus with hyperglycemia: Secondary | ICD-10-CM | POA: Diagnosis present

## 2014-04-06 DIAGNOSIS — I200 Unstable angina: Secondary | ICD-10-CM

## 2014-04-06 DIAGNOSIS — Z7952 Long term (current) use of systemic steroids: Secondary | ICD-10-CM | POA: Diagnosis not present

## 2014-04-06 DIAGNOSIS — Z6841 Body Mass Index (BMI) 40.0 and over, adult: Secondary | ICD-10-CM | POA: Diagnosis not present

## 2014-04-06 DIAGNOSIS — E039 Hypothyroidism, unspecified: Secondary | ICD-10-CM | POA: Diagnosis present

## 2014-04-06 DIAGNOSIS — R079 Chest pain, unspecified: Secondary | ICD-10-CM | POA: Diagnosis present

## 2014-04-06 DIAGNOSIS — E669 Obesity, unspecified: Secondary | ICD-10-CM | POA: Diagnosis present

## 2014-04-06 DIAGNOSIS — E785 Hyperlipidemia, unspecified: Secondary | ICD-10-CM | POA: Diagnosis present

## 2014-04-06 DIAGNOSIS — Z791 Long term (current) use of non-steroidal anti-inflammatories (NSAID): Secondary | ICD-10-CM | POA: Diagnosis not present

## 2014-04-06 DIAGNOSIS — I10 Essential (primary) hypertension: Secondary | ICD-10-CM | POA: Diagnosis present

## 2014-04-06 HISTORY — PX: LEFT HEART CATHETERIZATION WITH CORONARY ANGIOGRAM: SHX5451

## 2014-04-06 LAB — LIPID PANEL
Cholesterol: 164 mg/dL (ref 0–200)
HDL: 53 mg/dL (ref >39)
LDL CALC: 98 mg/dL (ref 0–99)
Total CHOL/HDL Ratio: 3.1 RATIO
Triglycerides: 67 mg/dL (ref <150)
VLDL: 13 mg/dL (ref 0–40)

## 2014-04-06 LAB — HEPATITIS PANEL, ACUTE
HCV Ab: NEGATIVE
HEP A IGM: NONREACTIVE
Hep B C IgM: NONREACTIVE
Hepatitis B Surface Ag: NEGATIVE

## 2014-04-06 LAB — BASIC METABOLIC PANEL
Anion gap: 8 (ref 5–15)
BUN: 20 mg/dL (ref 6–23)
CHLORIDE: 106 meq/L (ref 96–112)
CO2: 24 mmol/L (ref 19–32)
Calcium: 8.6 mg/dL (ref 8.4–10.5)
Creatinine, Ser: 0.95 mg/dL (ref 0.50–1.10)
GFR calc Af Amer: 78 mL/min — ABNORMAL LOW (ref >90)
GFR, EST NON AFRICAN AMERICAN: 68 mL/min — AB (ref >90)
GLUCOSE: 137 mg/dL — AB (ref 70–99)
POTASSIUM: 4.4 mmol/L (ref 3.5–5.1)
Sodium: 138 mmol/L (ref 135–145)

## 2014-04-06 LAB — HEMOGLOBIN A1C
Hgb A1c MFr Bld: 6.4 % — ABNORMAL HIGH (ref <5.7)
Mean Plasma Glucose: 137 mg/dL — ABNORMAL HIGH (ref <117)

## 2014-04-06 LAB — CBC
HCT: 36 % (ref 36.0–46.0)
HEMOGLOBIN: 12 g/dL (ref 12.0–15.0)
MCH: 32.2 pg (ref 26.0–34.0)
MCHC: 33.3 g/dL (ref 30.0–36.0)
MCV: 96.5 fL (ref 78.0–100.0)
Platelets: 179 10*3/uL (ref 150–400)
RBC: 3.73 MIL/uL — ABNORMAL LOW (ref 3.87–5.11)
RDW: 13.8 % (ref 11.5–15.5)
WBC: 5.2 10*3/uL (ref 4.0–10.5)

## 2014-04-06 LAB — TROPONIN I: Troponin I: <0.03 ng/mL (ref <0.031)

## 2014-04-06 LAB — PROTIME-INR
INR: 1.07 (ref 0.00–1.49)
PROTHROMBIN TIME: 14 s (ref 11.6–15.2)

## 2014-04-06 LAB — GLUCOSE, CAPILLARY: GLUCOSE-CAPILLARY: 117 mg/dL — AB (ref 70–99)

## 2014-04-06 SURGERY — LEFT HEART CATHETERIZATION WITH CORONARY ANGIOGRAM

## 2014-04-06 MED ORDER — HEPARIN SODIUM (PORCINE) 1000 UNIT/ML IJ SOLN
INTRAMUSCULAR | Status: AC
  Filled 2014-04-06: qty 1

## 2014-04-06 MED ORDER — FENTANYL CITRATE 0.05 MG/ML IJ SOLN
INTRAMUSCULAR | Status: AC
  Filled 2014-04-06: qty 2

## 2014-04-06 MED ORDER — LIDOCAINE HCL (PF) 1 % IJ SOLN
INTRAMUSCULAR | Status: AC
  Filled 2014-04-06: qty 30

## 2014-04-06 MED ORDER — ONDANSETRON HCL 4 MG/2ML IJ SOLN: 4.0000 mg | Freq: Four times a day (QID) | INTRAMUSCULAR | Status: DC | PRN

## 2014-04-06 MED ORDER — NITROGLYCERIN 1 MG/10 ML FOR IR/CATH LAB
INTRA_ARTERIAL | Status: AC
  Filled 2014-04-06: qty 10

## 2014-04-06 MED ORDER — HEPARIN (PORCINE) IN NACL 2-0.9 UNIT/ML-% IJ SOLN
INTRAMUSCULAR | Status: AC
  Filled 2014-04-06: qty 1000

## 2014-04-06 MED ORDER — PANTOPRAZOLE SODIUM 40 MG PO TBEC: 40.0000 mg | DELAYED_RELEASE_TABLET | Freq: Every day | ORAL | Status: DC

## 2014-04-06 MED ORDER — FUROSEMIDE 10 MG/ML IJ SOLN
20.0000 mg | Freq: Once | INTRAMUSCULAR | Status: AC
  Administered 2014-04-06: 20 mg via INTRAVENOUS

## 2014-04-06 MED ORDER — FUROSEMIDE 10 MG/ML IJ SOLN
INTRAMUSCULAR | Status: AC
  Filled 2014-04-06: qty 4

## 2014-04-06 MED ORDER — SODIUM CHLORIDE 0.9 % IV SOLN: INTRAVENOUS | Status: DC

## 2014-04-06 MED ORDER — ACETAMINOPHEN 325 MG PO TABS: 650.0000 mg | ORAL_TABLET | ORAL | Status: DC | PRN

## 2014-04-06 MED ORDER — MIDAZOLAM HCL 2 MG/2ML IJ SOLN
INTRAMUSCULAR | Status: AC
  Filled 2014-04-06: qty 2

## 2014-04-06 MED ORDER — ASPIRIN EC 81 MG PO TBEC: 81.0000 mg | DELAYED_RELEASE_TABLET | Freq: Every day | ORAL | Status: DC

## 2014-04-06 MED ORDER — VERAPAMIL HCL 2.5 MG/ML IV SOLN
INTRAVENOUS | Status: AC
  Filled 2014-04-06: qty 2

## 2014-04-06 NOTE — CV Procedure (Signed)
Cardiac Cath Procedure Note:  Indication: [place]  Procedures performed:  1) Selective coronary angiography 2) Left heart catheterization 3) Left ventriculogram  Description of procedure:   The risks and indication of the procedure were explained. Consent was signed and placed on the chart. An appropriate timeout was taken prior to the procedure. After a normal Allen's test was confirmed, the right wrist was prepped and draped in the routine sterile fashion and anesthetized with 1% local lidocaine.   A 5 FR arterial sheath was then placed in the right radial artery using a modified Seldinger technique. Systemic heparin was administered. 3mg  IV verapamil was given through the sheath. Standard catheters including a JL 3.5, JR4 and straight pigtail were used. All catheter exchanges were made over a wire.  Complications:  None apparent  Findings:  Ao Pressure: 108/66 (86) LV Pressure:  134/12/23 There was no signficant gradient across the aortic valve on pullback.  Left main: Normal  LAD:  Minimal plaque proximally. Small myocardial bridge with no flow obstruction. Large diagonal.   LCX:  Small ramus. Large branching OM-1. Mildly ectatic No CAD.  RCA: Dominant vessel. normal  LV-gram done in the RAO projection: Ejection fraction = 60-65% No wall motion abnormality  Assessment: 1. Essentially normal coronaries 2. Normal LV function with mildly increased LVEDP  Plan/Discussion:  No significant CAD. Mildly elevated LVEDP in setting of HTN. Focus on BP control. May benefit from low-dose diuretic. [place] for d/c today.  Glori Bickers MD 3:54 PM

## 2014-04-06 NOTE — Progress Notes (Signed)
[facility] PROGRESS NOTE  Joyce Sharp EVO:[id number] DOB: 1960-06-28 DOA: 04/05/2014 PCP: Donnajean Lopes, MD  Assessment/Plan: 1-Chest pain: troponin negative. Continue with aspirin, metoprolol.  Cath today per cardiology.  Stop meloxican. Started on Protonix.   2-HTN: continue with Hyzaar.  3-Rash; after invokana ? Started on prednisone by PCP. To finished taper.   4-Diabetes; invokana stopped due to rash. Follow up with PCP. SSI.   5-Transaminases; fatty liver. Need repeat LFT in 1 week. Holding statin. [place] negative for gallstone.   Disposition; transfer to [facility] for Cath.  Code; full code.   Consultants:  Cardiology  Procedures:  none  Antibiotics:  none  HPI/Subjective: Had small episode chest pain early this morning.    Objective: Filed Vitals:   04/06/14 0008  BP: 106/64  Pulse: 59  Temp: 98 F (36.7 C)  Resp: 18   No intake or output data in the 24 hours ending 04/06/14 0931 Filed Weights   04/05/14 1348  Weight: 102.967 kg (227 lb)    Exam:   General:  Alert in no distress.   Cardiovascular: S 1, S 2 RRR  Respiratory: CTA  Abdomen: BS present, soft, nt  Musculoskeletal: no edema   Data Reviewed: Basic Metabolic Panel:  Recent Labs Lab 04/05/14 0747 04/05/14 1405 04/06/14 0214  NA 138  --  138  K 3.9  --  4.4  CL 104  --  106  CO2 24  --  24  GLUCOSE 114*  --  137*  BUN 27*  --  20  CREATININE 1.19* 1.04 0.95  CALCIUM 9.5  --  8.6  MG  --  2.0  --    Liver Function Tests:  Recent Labs Lab 04/05/14 0747  AST 100*  ALT 168*  ALKPHOS 74  BILITOT 0.9  PROT 7.6  ALBUMIN 4.5    Recent Labs Lab 04/05/14 0747  LIPASE 28   No results for input(s): AMMONIA in the last 168 hours. CBC:  Recent Labs Lab 04/05/14 0747 04/05/14 1405 04/06/14 0214  WBC 8.3 7.9 5.2  NEUTROABS 5.4  --   --   HGB 13.6 13.4 12.0  HCT 40.3 40.4 36.0  MCV 95.3 96.7 96.5  PLT 205 200 179   Cardiac Enzymes:  Recent  Labs Lab 04/05/14 0747 04/05/14 1300 04/05/14 1924 04/06/14 0214  TROPONINI <0.03 <0.03 <0.03 <0.03   BNP (last 3 results) No results for input(s): PROBNP in the last 8760 hours. CBG:  Recent Labs Lab 04/05/14 2139  GLUCAP 139*    No results found for this or any previous visit (from the past 240 hour(s)).   Studies: Dg Chest 2 View  04/05/2014   CLINICAL DATA:  Mid chest pain and shortness of breath.  EXAM: CHEST  2 VIEW  COMPARISON:  06/02/2008  FINDINGS: The heart size and pulmonary vascularity are normal and the lungs are clear. Very has either a prominent right pericardial fat pad or a pericardial cyst but this is unchanged.  No effusions.  No osseous abnormality.  IMPRESSION: No active cardiopulmonary disease.   Electronically Signed   By: Rozetta Nunnery M.D.   On: 04/05/2014 08:07   US Abdomen Complete  04/05/2014   CLINICAL DATA:  Abdominal pain.  EXAM: ULTRASOUND ABDOMEN COMPLETE  COMPARISON:  CT 10/18/2004.  FINDINGS: Gallbladder: No gallstones or wall thickening visualized. No sonographic Murphy sign noted.  Common bile duct: Diameter: 4 mm  Liver: Liver is slightly echodense suggesting fatty infiltration.  IVC: No abnormality visualized.  Pancreas: Visualized portion unremarkable.  Spleen: Size and appearance within normal limits.  Right Kidney: Length: 10.6 cm. Echogenicity within normal limits. No mass or hydronephrosis visualized.  Left Kidney: Length: 10.6 cm. Echogenicity within normal limits. No mass or hydronephrosis visualized.  Abdominal aorta: No aneurysm visualized.  Other findings: None.  IMPRESSION: Echo dense liver suggesting fatty infiltration. Exam is otherwise unremarkable.   Electronically Signed   By: Marcello Moores  Register   On: 04/05/2014 09:24    Scheduled Meds: . Derrill Memo ON 04/07/2014] aspirin EC  81 mg Oral Daily  . carvedilol  3.125 mg Oral BID WC  . enoxaparin (LOVENOX) injection  40 mg Subcutaneous Q24H  . insulin aspart  0-5 Units Subcutaneous QHS  .  insulin aspart  0-9 Units Subcutaneous TID WC  . levothyroxine  100 mcg Oral QAC breakfast  . loratadine  10 mg Oral Daily  . [START ON 04/07/2014] losartan  100 mg Oral Daily  . pantoprazole  40 mg Oral Daily  . predniSONE  20 mg Oral QAC breakfast  . sodium chloride  3 mL Intravenous Q12H   Continuous Infusions: . sodium chloride 75 mL/hr (04/05/14 1723)  . sodium chloride    . sodium chloride 1 mL/kg/hr (04/06/14 0348)    Principal Problem:   Acute chest pain Active Problems:   HTN (hypertension)   Hyperlipidemia   Obesity   Transaminitis   Hypothyroidism   Chest pain   Hyperglycemia    Time spent: 35 minutes.     Niel Hummer A  [facility] Pager [phone]. If 7PM-7AM, please contact night-coverage at [email or web address], password [facility] 04/06/2014, 9:31 AM  LOS: 1 day

## 2014-04-06 NOTE — Discharge Instructions (Signed)
Radial Site Care °Refer to this sheet in the next few weeks. These instructions provide you with information on caring for yourself after your procedure. Your caregiver may also give you more specific instructions. Your treatment has been planned according to current medical practices, but problems sometimes occur. Call your caregiver if you have any problems or questions after your procedure. °HOME CARE INSTRUCTIONS °· You may shower the day after the procedure. Remove the bandage (dressing) and gently wash the site with plain soap and water. Gently pat the site dry. °· Do not apply powder or lotion to the site. °· Do not submerge the affected site in water for 3 to 5 days. °· Inspect the site at least twice daily. °· Do not flex or bend the affected arm for 24 hours. °· No lifting over 5 pounds (2.3 kg) for 5 days after your procedure. °· Do not drive home if you are discharged the same day of the procedure. Have someone else drive you. °· You may drive 24 hours after the procedure unless otherwise instructed by your caregiver. °· Do not operate machinery or power tools for 24 hours. °· A responsible adult should be with you for the first 24 hours after you arrive home. °What to expect: °· Any bruising will usually fade within 1 to 2 weeks. °· Blood that collects in the tissue (hematoma) may be painful to the touch. It should usually decrease in size and tenderness within 1 to 2 weeks. °SEEK IMMEDIATE MEDICAL CARE IF: °· You have unusual pain at the radial site. °· You have redness, warmth, swelling, or pain at the radial site. °· You have drainage (other than a small amount of blood on the dressing). °· You have chills. °· You have a fever or persistent symptoms for more than 72 hours. °· You have a fever and your symptoms suddenly get worse. °· Your arm becomes pale, cool, tingly, or numb. °· You have heavy bleeding from the site. Hold pressure on the site. °Document Released: 04/06/2010 Document Revised:  05/27/2011 Document Reviewed: 04/06/2010 °ExitCare® Patient Information ©2015 [employer]. This information is not intended to replace advice given to you by your health care provider. Make sure you discuss any questions you have with your health care provider. ° °

## 2014-04-06 NOTE — Interval H&P Note (Signed)
History and Physical Interval Note:  04/06/2014 3:22 PM  Joyce Sharp  has presented today for surgery, with the diagnosis of cp  The various methods of treatment have been discussed with the patient and family. After consideration of risks, benefits and other options for treatment, the patient has consented to  Procedure(s): LEFT HEART CATHETERIZATION WITH CORONARY ANGIOGRAM (N/A) with possible angioplasty as a surgical intervention .  The patient's history has been reviewed, patient examined, no change in status, stable for surgery.  I have reviewed the patient's chart and labs.  Questions were answered to the patient's satisfaction.   Cath Lab Visit (complete for each Cath Lab visit)  Clinical Evaluation Leading to the Procedure:   ACS: Yes.    Non-ACS:    Anginal Classification: CCS IV  Anti-ischemic medical therapy: Minimal Therapy (1 class of medications)  Non-Invasive Test Results: No non-invasive testing performed  Prior CABG: No previous CABG        Glori Bickers

## 2014-04-06 NOTE — H&P (View-Only) (Signed)
TELEMETRY: Reviewed telemetry pt in NSR: Filed Vitals:   04/05/14 1313 04/05/14 1348 04/05/14 2121 04/06/14 0008  BP: 141/66  116/73 106/64  Pulse: 63  60 59  Temp:   98 F (36.7 C) 98 F (36.7 C)  TempSrc:   Oral Oral  Resp: 15  18 18   Height:  5\' 3"  (1.6 m)    Weight:  227 lb (102.967 kg)    SpO2: 100%  98% 98%    Intake/Output Summary (Last 24 hours) at 04/06/14 1042 Last data filed at 04/06/14 0900  Gross per 24 hour  Intake      0 ml  Output      0 ml  Net      0 ml   Filed Weights   04/05/14 1348  Weight: 227 lb (102.967 kg)    Subjective Patient had some chest tightness at 7 am. Now resolved. Seems to be worse when lying down.  Derrill Memo ON 04/07/2014] aspirin EC  81 mg Oral Daily  . carvedilol  3.125 mg Oral BID WC  . enoxaparin (LOVENOX) injection  40 mg Subcutaneous Q24H  . insulin aspart  0-5 Units Subcutaneous QHS  . insulin aspart  0-9 Units Subcutaneous TID WC  . levothyroxine  100 mcg Oral QAC breakfast  . loratadine  10 mg Oral Daily  . [START ON 04/07/2014] losartan  100 mg Oral Daily  . pantoprazole  40 mg Oral Daily  . predniSONE  20 mg Oral QAC breakfast  . sodium chloride  3 mL Intravenous Q12H   . sodium chloride 75 mL/hr (04/05/14 1723)  . sodium chloride    . sodium chloride 1 mL/kg/hr (04/06/14 0348)    LABS: Basic Metabolic Panel:  Recent Labs  04/05/14 0747 04/05/14 1405 04/06/14 0214  NA 138  --  138  K 3.9  --  4.4  CL 104  --  106  CO2 24  --  24  GLUCOSE 114*  --  137*  BUN 27*  --  20  CREATININE 1.19* 1.04 0.95  CALCIUM 9.5  --  8.6  MG  --  2.0  --    Liver Function Tests:  Recent Labs  04/05/14 0747  AST 100*  ALT 168*  ALKPHOS 74  BILITOT 0.9  PROT 7.6  ALBUMIN 4.5    Recent Labs  04/05/14 0747  LIPASE 28   CBC:  Recent Labs  04/05/14 0747 04/05/14 1405 04/06/14 0214  WBC 8.3 7.9 5.2  NEUTROABS 5.4  --   --   HGB 13.6 13.4 12.0  HCT 40.3 40.4 36.0  MCV 95.3 96.7 96.5  PLT 205 200 179    Cardiac Enzymes:  Recent Labs  04/05/14 1300 04/05/14 1924 04/06/14 0214  TROPONINI <0.03 <0.03 <0.03   BNP: No results for input(s): PROBNP in the last 72 hours. D-Dimer: No results for input(s): DDIMER in the last 72 hours. Hemoglobin A1C:  Recent Labs  04/05/14 1411  HGBA1C 6.4*   Fasting Lipid Panel:  Recent Labs  04/06/14 0214  CHOL 164  HDL 53  LDLCALC 98  TRIG 67  CHOLHDL 3.1   Thyroid Function Tests:  Recent Labs  04/05/14 1405  TSH 1.337     Radiology/Studies:  Dg Chest 2 View  04/05/2014   CLINICAL DATA:  Mid chest pain and shortness of breath.  EXAM: CHEST  2 VIEW  COMPARISON:  06/02/2008  FINDINGS: The heart size and pulmonary vascularity are normal and the lungs are  clear. Very has either a prominent right pericardial fat pad or a pericardial cyst but this is unchanged.  No effusions.  No osseous abnormality.  IMPRESSION: No active cardiopulmonary disease.   Electronically Signed   By: Rozetta Nunnery M.D.   On: 04/05/2014 08:07   US Abdomen Complete  04/05/2014   CLINICAL DATA:  Abdominal pain.  EXAM: ULTRASOUND ABDOMEN COMPLETE  COMPARISON:  CT 10/18/2004.  FINDINGS: Gallbladder: No gallstones or wall thickening visualized. No sonographic Murphy sign noted.  Common bile duct: Diameter: 4 mm  Liver: Liver is slightly echodense suggesting fatty infiltration.  IVC: No abnormality visualized.  Pancreas: Visualized portion unremarkable.  Spleen: Size and appearance within normal limits.  Right Kidney: Length: 10.6 cm. Echogenicity within normal limits. No mass or hydronephrosis visualized.  Left Kidney: Length: 10.6 cm. Echogenicity within normal limits. No mass or hydronephrosis visualized.  Abdominal aorta: No aneurysm visualized.  Other findings: None.  IMPRESSION: Echo dense liver suggesting fatty infiltration. Exam is otherwise unremarkable.   Electronically Signed   By: Marcello Moores  Register   On: 04/05/2014 09:24    PHYSICAL EXAM General: Obese, in no  acute distress. Head: Normocephalic, atraumatic, sclera non-icteric, oropharynx is clear Neck: Negative for carotid bruits. JVD not elevated. No adenopathy Lungs: Clear bilaterally to auscultation without wheezes, rales, or rhonchi. Breathing is unlabored. Heart: RRR S1 S2 without murmurs, rubs, or gallops.  Abdomen: Soft, non-tender, non-distended with normoactive bowel sounds. No hepatomegaly. No rebound/guarding. No obvious abdominal masses. Msk:  Strength and tone appears normal for age. Extremities: No clubbing, cyanosis or edema.  Distal pedal pulses are 2+ and equal bilaterally. Neuro: Alert and oriented X 3. Moves all extremities spontaneously. Psych:  Responds to questions appropriately with a normal affect.  ASSESSMENT AND PLAN: 1. Chest pain with at least moderate risk of cardiac ischemia with multiple cardiac risk factors. For cardiac cath today. 2. DM type 2 3. HTN 4. Hyperlipidemia.   Questions concerning cardiac cath answered.  Present on Admission:  . Acute chest pain . HTN (hypertension) . Hyperlipidemia . Obesity . Transaminitis . Hypothyroidism . Chest pain  Signed, Peter Martin[place], [place] 04/06/2014 10:42 AM

## 2014-04-06 NOTE — Progress Notes (Signed)
TELEMETRY: Reviewed telemetry pt in NSR: Filed Vitals:   04/05/14 1313 04/05/14 1348 04/05/14 2121 04/06/14 0008  BP: 141/66  116/73 106/64  Pulse: 63  60 59  Temp:   98 F (36.7 C) 98 F (36.7 C)  TempSrc:   Oral Oral  Resp: 15  18 18   Height:  5\' 3"  (1.6 m)    Weight:  227 lb (102.967 kg)    SpO2: 100%  98% 98%    Intake/Output Summary (Last 24 hours) at 04/06/14 1042 Last data filed at 04/06/14 0900  Gross per 24 hour  Intake      0 ml  Output      0 ml  Net      0 ml   Filed Weights   04/05/14 1348  Weight: 227 lb (102.967 kg)    Subjective Patient had some chest tightness at 7 am. Now resolved. Seems to be worse when lying down.  Derrill Memo ON 04/07/2014] aspirin EC  81 mg Oral Daily  . carvedilol  3.125 mg Oral BID WC  . enoxaparin (LOVENOX) injection  40 mg Subcutaneous Q24H  . insulin aspart  0-5 Units Subcutaneous QHS  . insulin aspart  0-9 Units Subcutaneous TID WC  . levothyroxine  100 mcg Oral QAC breakfast  . loratadine  10 mg Oral Daily  . [START ON 04/07/2014] losartan  100 mg Oral Daily  . pantoprazole  40 mg Oral Daily  . predniSONE  20 mg Oral QAC breakfast  . sodium chloride  3 mL Intravenous Q12H   . sodium chloride 75 mL/hr (04/05/14 1723)  . sodium chloride    . sodium chloride 1 mL/kg/hr (04/06/14 0348)    LABS: Basic Metabolic Panel:  Recent Labs  04/05/14 0747 04/05/14 1405 04/06/14 0214  NA 138  --  138  K 3.9  --  4.4  CL 104  --  106  CO2 24  --  24  GLUCOSE 114*  --  137*  BUN 27*  --  20  CREATININE 1.19* 1.04 0.95  CALCIUM 9.5  --  8.6  MG  --  2.0  --    Liver Function Tests:  Recent Labs  04/05/14 0747  AST 100*  ALT 168*  ALKPHOS 74  BILITOT 0.9  PROT 7.6  ALBUMIN 4.5    Recent Labs  04/05/14 0747  LIPASE 28   CBC:  Recent Labs  04/05/14 0747 04/05/14 1405 04/06/14 0214  WBC 8.3 7.9 5.2  NEUTROABS 5.4  --   --   HGB 13.6 13.4 12.0  HCT 40.3 40.4 36.0  MCV 95.3 96.7 96.5  PLT 205 200 179    Cardiac Enzymes:  Recent Labs  04/05/14 1300 04/05/14 1924 04/06/14 0214  TROPONINI <0.03 <0.03 <0.03   BNP: No results for input(s): PROBNP in the last 72 hours. D-Dimer: No results for input(s): DDIMER in the last 72 hours. Hemoglobin A1C:  Recent Labs  04/05/14 1411  HGBA1C 6.4*   Fasting Lipid Panel:  Recent Labs  04/06/14 0214  CHOL 164  HDL 53  LDLCALC 98  TRIG 67  CHOLHDL 3.1   Thyroid Function Tests:  Recent Labs  04/05/14 1405  TSH 1.337     Radiology/Studies:  Dg Chest 2 View  04/05/2014   CLINICAL DATA:  Mid chest pain and shortness of breath.  EXAM: CHEST  2 VIEW  COMPARISON:  06/02/2008  FINDINGS: The heart size and pulmonary vascularity are normal and the lungs are  clear. Very has either a prominent right pericardial fat pad or a pericardial cyst but this is unchanged.  No effusions.  No osseous abnormality.  IMPRESSION: No active cardiopulmonary disease.   Electronically Signed   By: Rozetta Nunnery M.D.   On: 04/05/2014 08:07   US Abdomen Complete  04/05/2014   CLINICAL DATA:  Abdominal pain.  EXAM: ULTRASOUND ABDOMEN COMPLETE  COMPARISON:  CT 10/18/2004.  FINDINGS: Gallbladder: No gallstones or wall thickening visualized. No sonographic Murphy sign noted.  Common bile duct: Diameter: 4 mm  Liver: Liver is slightly echodense suggesting fatty infiltration.  IVC: No abnormality visualized.  Pancreas: Visualized portion unremarkable.  Spleen: Size and appearance within normal limits.  Right Kidney: Length: 10.6 cm. Echogenicity within normal limits. No mass or hydronephrosis visualized.  Left Kidney: Length: 10.6 cm. Echogenicity within normal limits. No mass or hydronephrosis visualized.  Abdominal aorta: No aneurysm visualized.  Other findings: None.  IMPRESSION: Echo dense liver suggesting fatty infiltration. Exam is otherwise unremarkable.   Electronically Signed   By: Marcello Moores  Register   On: 04/05/2014 09:24    PHYSICAL EXAM General: Obese, in no  acute distress. Head: Normocephalic, atraumatic, sclera non-icteric, oropharynx is clear Neck: Negative for carotid bruits. JVD not elevated. No adenopathy Lungs: Clear bilaterally to auscultation without wheezes, rales, or rhonchi. Breathing is unlabored. Heart: RRR S1 S2 without murmurs, rubs, or gallops.  Abdomen: Soft, non-tender, non-distended with normoactive bowel sounds. No hepatomegaly. No rebound/guarding. No obvious abdominal masses. Msk:  Strength and tone appears normal for age. Extremities: No clubbing, cyanosis or edema.  Distal pedal pulses are 2+ and equal bilaterally. Neuro: Alert and oriented X 3. Moves all extremities spontaneously. Psych:  Responds to questions appropriately with a normal affect.  ASSESSMENT AND PLAN: 1. Chest pain with at least moderate risk of cardiac ischemia with multiple cardiac risk factors. For cardiac cath today. 2. DM type 2 3. HTN 4. Hyperlipidemia.   Questions concerning cardiac cath answered.  Present on Admission:  . Acute chest pain . HTN (hypertension) . Hyperlipidemia . Obesity . Transaminitis . Hypothyroidism . Chest pain  Signed, Jeanise Durfey [place], [place] 04/06/2014 10:42 AM

## 2014-04-06 NOTE — Discharge Summary (Signed)
Physician Discharge Summary    PCP:  Donnajean Lopes, MD  Patient ID: Joyce Sharp MRN: [id number] DOB/AGE: May 14, 1960 54 y.o.  Admit date: 04/05/2014 Discharge date: 04/06/2014  Admission Diagnoses:  Chest Pain  Discharge Diagnoses:  Principal Problem:   Acute chest pain Active Problems:   HTN (hypertension)   Hyperlipidemia   Obesity   Transaminitis   Hypothyroidism   Chest pain   Hyperglycemia   Discharged Condition: stable  Hospital Course:   The patient is a 54 year old moderately overweight married Caucasian female mother to children who works as a [employer] fundraiser. Primary care physician is Dr. Bevelyn Buckles. We are asked to see her for evaluation of New-onset chest pain. Her structures include family history with a father who died at age 69 of a myocardial infarction into brothers have had ischemic heart disease and intervention. She is a newly diagnosed non-insulin-dependent requiring diabetic. She has a history of treated hypertension and hyperlipidemia.she has never had a heart attack or stroke. She has had a partial hysterectomy. Approximately 3 weeks ago she had new onset chest and epigastric pain. This has been waxing and waning. It is not associated with activity food or position. There is no history of reflux or peptic ulcer disease. There is associated left shoulder discomfort along with shortness of breath. She was admitted today for evaluation of these symptoms. Abdominal ultrasound showed no evidence of cholecystitis although her liver enzymes are mildly elevated. There was a comment of "fatty liver". She is on a statin drug. Her EKG shows no acute changes. Her enzymes are negative.  She ruled out for MI.  She underwent left heart cath which revealed normal coronary arteries and LV function with mildly increased LVEDP.  Continue protonix.  Follow up with PCP.  The patient was seen by Dr. [place] who felt she was stable for DC home.  BP stable.     Dr.  Tyrell Antonio was informed that Cardiology was discharging the patient.  Consults: Cardiology  Significant Diagnostic Studies:  Lipid Panel     Component Value Date/Time   CHOL 164 04/06/2014 0214   TRIG 67 04/06/2014 0214   HDL 53 04/06/2014 0214   CHOLHDL 3.1 04/06/2014 0214   VLDL 13 04/06/2014 0214   LDLCALC 98 04/06/2014 0214   .   Cardiac Cath Procedure Note:  Indication: [place]  Procedures performed:  1) Selective coronary angiography 2) Left heart catheterization 3) Left ventriculogram  Description of procedure:   The risks and indication of the procedure were explained. Consent was signed and placed on the chart. An appropriate timeout was taken prior to the procedure. After a normal Allen's test was confirmed, the right wrist was prepped and draped in the routine sterile fashion and anesthetized with 1% local lidocaine.   A 5 FR arterial sheath was then placed in the right radial artery using a modified Seldinger technique. Systemic heparin was administered. 3mg  IV verapamil was given through the sheath. Standard catheters including a JL 3.5, JR4 and straight pigtail were used. All catheter exchanges were made over a wire.  Complications: None apparent  Findings:  Ao Pressure: 108/66 (86) LV Pressure: 134/12/23 There was no signficant gradient across the aortic valve on pullback.  Left main: Normal  LAD: Minimal plaque proximally. Small myocardial bridge with no flow obstruction. Large diagonal.   LCX: Small ramus. Large branching OM-1. Mildly ectatic No CAD.  RCA: Dominant vessel. normal  LV-gram done in the RAO projection: Ejection fraction = 60-65% No wall motion  abnormality  Assessment: 1. Essentially normal coronaries 2. Normal LV function with mildly increased LVEDP  Plan/Discussion:  No significant CAD. Mildly elevated LVEDP in setting of HTN. Focus on BP control. May benefit from low-dose diuretic. [place] for d/c today.  Glori Bickers  MD   Treatments: See above  Discharge Exam: Blood pressure 126/67, pulse 55, temperature 98 F (36.7 C), temperature source Oral, resp. rate 17, height 5\' 3"  (1.6 m), weight 227 lb (102.967 kg), SpO2 97 %.   Disposition:  DC home      Discharge Instructions    Diet - low sodium heart healthy    Complete by:  As directed      Discharge instructions    Complete by:  As directed   No lifting with right arm for three days     Increase activity slowly    Complete by:  As directed             Medication List    STOP taking these medications        influenza vac recombinant HA trivalent injection  Commonly known as:  FLUBLOK      TAKE these medications        acetaminophen 500 MG tablet  Commonly known as:  TYLENOL  Take 1,000 mg by mouth every 6 (six) hours as needed for mild pain or headache.     levothyroxine 100 MCG tablet  Commonly known as:  SYNTHROID, LEVOTHROID  Take 100 mcg by mouth daily before breakfast.     loratadine 10 MG tablet  Commonly known as:  CLARITIN  Take 10 mg by mouth daily.     losartan-hydrochlorothiazide 100-12.5 MG per tablet  Commonly known as:  HYZAAR  Take 1 tablet by mouth daily with breakfast.     meloxicam 15 MG tablet  Commonly known as:  MOBIC  Take 15 mg by mouth daily as needed.     pantoprazole 40 MG tablet  Commonly known as:  PROTONIX  Take 1 tablet (40 mg total) by mouth daily.     predniSONE 20 MG tablet  Commonly known as:  DELTASONE  Take 20 mg by mouth daily.     simvastatin 40 MG tablet  Commonly known as:  ZOCOR  Take 40 mg by mouth daily with breakfast.       Follow-up Information    Follow up with Donnajean Lopes, MD.   Specialty:  Internal Medicine   Contact information:   [address] [place] [address] [phone]      Greater than 30 minutes was spent completing the patient's discharge.    SignedTarri Fuller, [place] 04/06/2014, 5:12 PM

## 2014-04-08 ENCOUNTER — Institutional Professional Consult (permissible substitution): Payer: Self-pay | Admitting: Neurology

## 2014-04-08 LAB — GLUCOSE, CAPILLARY
Glucose-Capillary: 94 mg/dL (ref 70–99)
Glucose-Capillary: 89 mg/dL (ref 70–99)

## 2014-04-21 ENCOUNTER — Other Ambulatory Visit: Payer: Self-pay | Admitting: Internal Medicine

## 2014-04-21 DIAGNOSIS — R079 Chest pain, unspecified: Secondary | ICD-10-CM

## 2014-04-25 ENCOUNTER — Ambulatory Visit
Admission: RE | Admit: 2014-04-25 | Discharge: 2014-04-25 | Disposition: A | Discharge status: Home / Self Care | Payer: BC Managed Care – PPO | Source: Ambulatory Visit | Attending: Internal Medicine | Admitting: Internal Medicine

## 2014-04-25 DIAGNOSIS — R079 Chest pain, unspecified: Secondary | ICD-10-CM

## 2014-10-14 ENCOUNTER — Other Ambulatory Visit: Payer: Self-pay

## 2014-10-14 DIAGNOSIS — Z1231 Encounter for screening mammogram for malignant neoplasm of breast: Secondary | ICD-10-CM

## 2014-10-25 ENCOUNTER — Other Ambulatory Visit: Payer: Self-pay | Admitting: Physician Assistant

## 2014-10-25 NOTE — Telephone Encounter (Signed)
REFILL 

## 2014-10-31 ENCOUNTER — Ambulatory Visit
Admission: RE | Admit: 2014-10-31 | Discharge: 2014-10-31 | Disposition: A | Discharge status: Home / Self Care | Payer: BC Managed Care – PPO | Source: Ambulatory Visit

## 2014-10-31 DIAGNOSIS — Z1231 Encounter for screening mammogram for malignant neoplasm of breast: Secondary | ICD-10-CM

## 2014-11-02 ENCOUNTER — Other Ambulatory Visit: Payer: Self-pay | Admitting: Internal Medicine

## 2014-11-02 DIAGNOSIS — R928 Other abnormal and inconclusive findings on diagnostic imaging of breast: Secondary | ICD-10-CM

## 2014-11-11 ENCOUNTER — Ambulatory Visit
Admission: RE | Admit: 2014-11-11 | Discharge: 2014-11-11 | Disposition: A | Discharge status: Home / Self Care | Payer: BC Managed Care – PPO | Source: Ambulatory Visit | Attending: Internal Medicine | Admitting: Internal Medicine

## 2014-11-11 DIAGNOSIS — R928 Other abnormal and inconclusive findings on diagnostic imaging of breast: Secondary | ICD-10-CM

## 2014-12-01 ENCOUNTER — Other Ambulatory Visit: Payer: Self-pay | Admitting: Physician Assistant

## 2015-01-18 ENCOUNTER — Other Ambulatory Visit: Payer: Self-pay | Admitting: Physician Assistant

## 2015-01-19 NOTE — Telephone Encounter (Signed)
Pt requesting a refill on protonix, it was refill before in your name on 10/25/2014, Luisa Dago, [place], pt still have not been seen in our office, nor does pt have an appointment. Please advise

## 2015-01-19 NOTE — Telephone Encounter (Signed)
The patient needs to see her PCP to continue this medication.  She does not have any current cardiology issues.. Followup here is as needed.  Tarri Fuller PAC

## 2015-01-27 ENCOUNTER — Other Ambulatory Visit: Payer: Self-pay | Admitting: Physician Assistant

## 2015-03-15 ENCOUNTER — Other Ambulatory Visit: Payer: Self-pay | Admitting: Internal Medicine

## 2015-03-15 DIAGNOSIS — R740 Nonspecific elevation of levels of transaminase and lactic acid dehydrogenase [LDH]: Principal | ICD-10-CM

## 2015-03-15 DIAGNOSIS — R7401 Elevation of levels of liver transaminase levels: Secondary | ICD-10-CM

## 2015-03-22 ENCOUNTER — Other Ambulatory Visit: Payer: BC Managed Care – PPO

## 2015-04-03 ENCOUNTER — Other Ambulatory Visit: Payer: BC Managed Care – PPO

## 2015-10-11 ENCOUNTER — Other Ambulatory Visit: Payer: Self-pay | Admitting: Internal Medicine

## 2015-10-11 DIAGNOSIS — Z1231 Encounter for screening mammogram for malignant neoplasm of breast: Secondary | ICD-10-CM

## 2015-11-02 ENCOUNTER — Ambulatory Visit
Admission: RE | Admit: 2015-11-02 | Discharge: 2015-11-02 | Disposition: A | Discharge status: Home / Self Care | Payer: BC Managed Care – PPO | Source: Ambulatory Visit | Attending: Internal Medicine | Admitting: Internal Medicine

## 2015-11-02 DIAGNOSIS — Z1231 Encounter for screening mammogram for malignant neoplasm of breast: Secondary | ICD-10-CM

## 2016-09-26 ENCOUNTER — Other Ambulatory Visit: Payer: Self-pay | Admitting: Internal Medicine

## 2016-09-26 DIAGNOSIS — Z1231 Encounter for screening mammogram for malignant neoplasm of breast: Secondary | ICD-10-CM

## 2016-11-07 ENCOUNTER — Ambulatory Visit
Admission: RE | Admit: 2016-11-07 | Discharge: 2016-11-07 | Disposition: A | Discharge status: Home / Self Care | Payer: BC Managed Care – PPO | Source: Ambulatory Visit | Attending: Internal Medicine | Admitting: Internal Medicine

## 2016-11-07 DIAGNOSIS — Z1231 Encounter for screening mammogram for malignant neoplasm of breast: Secondary | ICD-10-CM

## 2017-02-14 ENCOUNTER — Other Ambulatory Visit: Payer: Self-pay

## 2017-02-14 ENCOUNTER — Encounter (HOSPITAL_BASED_OUTPATIENT_CLINIC_OR_DEPARTMENT_OTHER): Payer: Self-pay

## 2017-02-14 NOTE — Progress Notes (Signed)
Npo after midnight arive 1030am 02-20-17 [facility] take levothyroxine, pantaprazole, simvastatin, propranolol sip of water in am, husband driver, needs istat 4 and ekg

## 2017-02-17 ENCOUNTER — Encounter (HOSPITAL_BASED_OUTPATIENT_CLINIC_OR_DEPARTMENT_OTHER): Payer: Self-pay | Admitting: *Deleted

## 2017-02-17 NOTE — Progress Notes (Signed)
CALLED AND SPOKE W/ DR ROSE MDA VIA PHONE TO REVIEW PT CHART.  PER DR ROSE MDA OK TO PROCEED.

## 2017-02-20 ENCOUNTER — Ambulatory Visit (HOSPITAL_BASED_OUTPATIENT_CLINIC_OR_DEPARTMENT_OTHER): Payer: BC Managed Care – PPO | Admitting: Anesthesiology

## 2017-02-20 ENCOUNTER — Ambulatory Visit (HOSPITAL_BASED_OUTPATIENT_CLINIC_OR_DEPARTMENT_OTHER)
Admission: RE | Admit: 2017-02-20 | Discharge: 2017-02-20 | Disposition: A | Discharge status: Home / Self Care | Payer: BC Managed Care – PPO | Source: Ambulatory Visit | Attending: Orthopedic Surgery | Admitting: Orthopedic Surgery

## 2017-02-20 ENCOUNTER — Ambulatory Visit (HOSPITAL_COMMUNITY): Payer: BC Managed Care – PPO

## 2017-02-20 ENCOUNTER — Encounter (HOSPITAL_BASED_OUTPATIENT_CLINIC_OR_DEPARTMENT_OTHER): Payer: Self-pay

## 2017-02-20 ENCOUNTER — Encounter (HOSPITAL_BASED_OUTPATIENT_CLINIC_OR_DEPARTMENT_OTHER)
Admission: RE | Disposition: A | Discharge status: Home / Self Care | Payer: Self-pay | Source: Ambulatory Visit | Attending: Orthopedic Surgery

## 2017-02-20 ENCOUNTER — Other Ambulatory Visit: Payer: Self-pay

## 2017-02-20 DIAGNOSIS — S83281A Other tear of lateral meniscus, current injury, right knee, initial encounter: Secondary | ICD-10-CM | POA: Diagnosis present

## 2017-02-20 DIAGNOSIS — M659 Synovitis and tenosynovitis, unspecified: Secondary | ICD-10-CM | POA: Insufficient documentation

## 2017-02-20 DIAGNOSIS — X58XXXA Exposure to other specified factors, initial encounter: Secondary | ICD-10-CM | POA: Insufficient documentation

## 2017-02-20 DIAGNOSIS — E7800 Pure hypercholesterolemia, unspecified: Secondary | ICD-10-CM | POA: Diagnosis not present

## 2017-02-20 DIAGNOSIS — Z6841 Body Mass Index (BMI) 40.0 and over, adult: Secondary | ICD-10-CM | POA: Diagnosis not present

## 2017-02-20 DIAGNOSIS — M94261 Chondromalacia, right knee: Secondary | ICD-10-CM | POA: Insufficient documentation

## 2017-02-20 DIAGNOSIS — K219 Gastro-esophageal reflux disease without esophagitis: Secondary | ICD-10-CM | POA: Insufficient documentation

## 2017-02-20 DIAGNOSIS — M23251 Derangement of posterior horn of lateral meniscus due to old tear or injury, right knee: Secondary | ICD-10-CM | POA: Diagnosis not present

## 2017-02-20 DIAGNOSIS — Z79899 Other long term (current) drug therapy: Secondary | ICD-10-CM | POA: Insufficient documentation

## 2017-02-20 DIAGNOSIS — Z8541 Personal history of malignant neoplasm of cervix uteri: Secondary | ICD-10-CM | POA: Insufficient documentation

## 2017-02-20 DIAGNOSIS — S83241A Other tear of medial meniscus, current injury, right knee, initial encounter: Secondary | ICD-10-CM | POA: Diagnosis present

## 2017-02-20 DIAGNOSIS — E039 Hypothyroidism, unspecified: Secondary | ICD-10-CM | POA: Diagnosis not present

## 2017-02-20 DIAGNOSIS — I10 Essential (primary) hypertension: Secondary | ICD-10-CM | POA: Insufficient documentation

## 2017-02-20 DIAGNOSIS — M25561 Pain in right knee: Secondary | ICD-10-CM | POA: Diagnosis present

## 2017-02-20 DIAGNOSIS — M84361A Stress fracture, right tibia, initial encounter for fracture: Secondary | ICD-10-CM | POA: Insufficient documentation

## 2017-02-20 HISTORY — DX: Adverse effect of unspecified anesthetic, initial encounter: T41.45XA

## 2017-02-20 HISTORY — DX: Unspecified osteoarthritis, unspecified site: M19.90

## 2017-02-20 HISTORY — DX: Other tear of medial meniscus, current injury, unspecified knee, initial encounter: S83.249A

## 2017-02-20 HISTORY — DX: Personal history of malignant neoplasm of cervix uteri: Z85.41

## 2017-02-20 HISTORY — DX: Other complications of anesthesia, initial encounter: T88.59XA

## 2017-02-20 HISTORY — DX: Personal history of other diseases of the digestive system: Z87.19

## 2017-02-20 HISTORY — PX: KNEE ARTHROSCOPY WITH SUBCHONDROPLASTY: SHX6732

## 2017-02-20 HISTORY — DX: Gastro-esophageal reflux disease without esophagitis: K21.9

## 2017-02-20 HISTORY — DX: Personal history of urinary calculi: Z87.442

## 2017-02-20 LAB — POCT I-STAT 4, (NA,K, GLUC, HGB,HCT)
Glucose, Bld: 112 mg/dL — ABNORMAL HIGH (ref 65–99)
HEMATOCRIT: 37 % (ref 36.0–46.0)
HEMOGLOBIN: 12.6 g/dL (ref 12.0–15.0)
Potassium: 3.6 mmol/L (ref 3.5–5.1)
Sodium: 141 mmol/L (ref 135–145)

## 2017-02-20 SURGERY — ARTHROSCOPY, KNEE, WITH SUBCHONDROPLASTY
Anesthesia: General | Site: Knee | Laterality: Right

## 2017-02-20 MED ORDER — CEFAZOLIN SODIUM-DEXTROSE 1-4 GM/50ML-% IV SOLN
INTRAVENOUS | Status: AC
  Filled 2017-02-20: qty 50

## 2017-02-20 MED ORDER — OXYCODONE HCL 5 MG PO TABS
ORAL_TABLET | ORAL | Status: AC
  Filled 2017-02-20: qty 1

## 2017-02-20 MED ORDER — CEFAZOLIN SODIUM-DEXTROSE 2-4 GM/100ML-% IV SOLN
INTRAVENOUS | Status: AC
  Filled 2017-02-20: qty 100

## 2017-02-20 MED ORDER — FENTANYL CITRATE (PF) 100 MCG/2ML IJ SOLN
INTRAMUSCULAR | Status: DC | PRN
  Administered 2017-02-20: 100 ug via INTRAVENOUS

## 2017-02-20 MED ORDER — MEPERIDINE HCL 25 MG/ML IJ SOLN
6.2500 mg | INTRAMUSCULAR | Status: DC | PRN
  Filled 2017-02-20: qty 1

## 2017-02-20 MED ORDER — PROPOFOL 10 MG/ML IV BOLUS
INTRAVENOUS | Status: DC | PRN
  Administered 2017-02-20: 200 mg via INTRAVENOUS

## 2017-02-20 MED ORDER — FENTANYL CITRATE (PF) 100 MCG/2ML IJ SOLN
INTRAMUSCULAR | Status: AC
  Filled 2017-02-20: qty 2

## 2017-02-20 MED ORDER — DEXTROSE 5 % IV SOLN
3.0000 g | INTRAVENOUS | Status: AC
  Administered 2017-02-20: 3 g via INTRAVENOUS
  Filled 2017-02-20: qty 3000

## 2017-02-20 MED ORDER — DEXAMETHASONE SODIUM PHOSPHATE 10 MG/ML IJ SOLN
INTRAMUSCULAR | Status: AC
  Filled 2017-02-20: qty 1

## 2017-02-20 MED ORDER — ONDANSETRON 4 MG PO TBDP: 4.0000 mg | ORAL_TABLET | Freq: Three times a day (TID) | ORAL | 0 refills | Status: DC | PRN

## 2017-02-20 MED ORDER — LACTATED RINGERS IV SOLN
INTRAVENOUS | Status: DC
  Administered 2017-02-20 (×2): via INTRAVENOUS
  Filled 2017-02-20: qty 1000

## 2017-02-20 MED ORDER — LIDOCAINE 2% (20 MG/ML) 5 ML SYRINGE
INTRAMUSCULAR | Status: DC | PRN
  Administered 2017-02-20: 100 mg via INTRAVENOUS

## 2017-02-20 MED ORDER — CHLORHEXIDINE GLUCONATE 4 % EX LIQD
60.0000 mL | Freq: Once | CUTANEOUS | Status: DC
  Filled 2017-02-20: qty 118

## 2017-02-20 MED ORDER — LACTATED RINGERS IV SOLN
INTRAVENOUS | Status: DC
  Filled 2017-02-20: qty 1000

## 2017-02-20 MED ORDER — PROPOFOL 500 MG/50ML IV EMUL
INTRAVENOUS | Status: AC
  Filled 2017-02-20: qty 50

## 2017-02-20 MED ORDER — SODIUM CHLORIDE 0.9 % IR SOLN
Status: DC | PRN
  Administered 2017-02-20: 6000 mL

## 2017-02-20 MED ORDER — PROPOFOL 10 MG/ML IV BOLUS
INTRAVENOUS | Status: AC
  Filled 2017-02-20: qty 20

## 2017-02-20 MED ORDER — BUPIVACAINE HCL 0.25 % IJ SOLN
INTRAMUSCULAR | Status: DC | PRN
  Administered 2017-02-20: 30 mL

## 2017-02-20 MED ORDER — FENTANYL CITRATE (PF) 100 MCG/2ML IJ SOLN
25.0000 ug | INTRAMUSCULAR | Status: DC | PRN
  Administered 2017-02-20 (×2): 25 ug via INTRAVENOUS
  Filled 2017-02-20: qty 1

## 2017-02-20 MED ORDER — LIDOCAINE 2% (20 MG/ML) 5 ML SYRINGE
INTRAMUSCULAR | Status: AC
  Filled 2017-02-20: qty 5

## 2017-02-20 MED ORDER — MIDAZOLAM HCL 2 MG/2ML IJ SOLN
INTRAMUSCULAR | Status: DC | PRN
  Administered 2017-02-20: 2 mg via INTRAVENOUS

## 2017-02-20 MED ORDER — PROPOFOL 500 MG/50ML IV EMUL
INTRAVENOUS | Status: DC | PRN
  Administered 2017-02-20: 100 ug/kg/min via INTRAVENOUS

## 2017-02-20 MED ORDER — KETOROLAC TROMETHAMINE 30 MG/ML IJ SOLN
INTRAMUSCULAR | Status: DC | PRN
  Administered 2017-02-20: 30 mg via INTRAVENOUS

## 2017-02-20 MED ORDER — ONDANSETRON HCL 4 MG/2ML IJ SOLN
INTRAMUSCULAR | Status: DC | PRN
  Administered 2017-02-20: 4 mg via INTRAVENOUS

## 2017-02-20 MED ORDER — OXYCODONE HCL 5 MG PO TABS: 5.0000 mg | ORAL_TABLET | ORAL | 0 refills | Status: AC | PRN

## 2017-02-20 MED ORDER — DEXAMETHASONE SODIUM PHOSPHATE 10 MG/ML IJ SOLN
INTRAMUSCULAR | Status: DC | PRN
  Administered 2017-02-20: 10 mg via INTRAVENOUS

## 2017-02-20 MED ORDER — MIDAZOLAM HCL 2 MG/2ML IJ SOLN
INTRAMUSCULAR | Status: AC
  Filled 2017-02-20: qty 2

## 2017-02-20 MED ORDER — METOCLOPRAMIDE HCL 5 MG/ML IJ SOLN
10.0000 mg | Freq: Once | INTRAMUSCULAR | Status: DC | PRN
  Filled 2017-02-20: qty 2

## 2017-02-20 SURGICAL SUPPLY — 43 items
BANDAGE ELASTIC 6 VELCRO ST LF (GAUZE/BANDAGES/DRESSINGS) ×3 IMPLANT
BANDAGE ESMARK 6X9 LF (GAUZE/BANDAGES/DRESSINGS) IMPLANT
BLADE CUDA GRT WHITE 3.5 (BLADE) ×3 IMPLANT
BLADE CUTTER GATOR 3.5 (BLADE) IMPLANT
BLADE GREAT WHITE 4.2 (BLADE) IMPLANT
BLADE GREAT WHITE 4.2MM (BLADE)
BNDG ESMARK 6X9 LF (GAUZE/BANDAGES/DRESSINGS)
CUFF TOURNIQUET SINGLE 34IN LL (TOURNIQUET CUFF) ×3 IMPLANT
DRAPE ARTHROSCOPY W/POUCH 114 (DRAPES) ×3 IMPLANT
DRAPE U-SHAPE 47X51 STRL (DRAPES) ×3 IMPLANT
DRSG PAD ABDOMINAL 8X10 ST (GAUZE/BANDAGES/DRESSINGS) ×3 IMPLANT
DURAPREP 26ML APPLICATOR (WOUND CARE) ×3 IMPLANT
GAUZE SPONGE 4X4 12PLY STRL (GAUZE/BANDAGES/DRESSINGS) ×3 IMPLANT
GAUZE SPONGE 4X4 12PLY STRL LF (GAUZE/BANDAGES/DRESSINGS) ×3 IMPLANT
GAUZE XEROFORM 1X8 LF (GAUZE/BANDAGES/DRESSINGS) ×3 IMPLANT
GLOVE BIO SURGEON STRL SZ7.5 (GLOVE) ×3 IMPLANT
GLOVE BIOGEL PI IND STRL 8 (GLOVE) ×1 IMPLANT
GLOVE BIOGEL PI INDICATOR 8 (GLOVE) ×2
GOWN STRL REUS W/TWL LRG LVL3 (GOWN DISPOSABLE) ×3 IMPLANT
IV NS IRRIG 3000ML ARTHROMATIC (IV SOLUTION) ×6 IMPLANT
KIT MIXER ACCUMIX (KITS) ×3 IMPLANT
KIT RM TURNOVER CYSTO AR (KITS) ×3 IMPLANT
KNEE KIT SCP W/SIDE ACCUPORT (Joint) ×3 IMPLANT
KNEE WRAP E Z 3 GEL PACK (MISCELLANEOUS) ×3 IMPLANT
MANIFOLD NEPTUNE II (INSTRUMENTS) ×3 IMPLANT
PACK ARTHROSCOPY DSU (CUSTOM PROCEDURE TRAY) ×3 IMPLANT
PACK BASIN DAY SURGERY FS (CUSTOM PROCEDURE TRAY) ×3 IMPLANT
PAD ABD 8X10 STRL (GAUZE/BANDAGES/DRESSINGS) ×3 IMPLANT
PAD ARMBOARD 7.5X6 YLW CONV (MISCELLANEOUS) ×3 IMPLANT
PAD CAST 4YDX4 CTTN HI CHSV (CAST SUPPLIES) ×1 IMPLANT
PADDING CAST COTTON 4X4 STRL (CAST SUPPLIES) ×2
PROBE BIPOLAR 50 DEGREE SUCT (MISCELLANEOUS) ×3 IMPLANT
PROBE BIPOLAR ATHRO 135MM 90D (MISCELLANEOUS) IMPLANT
SET ARTHROSCOPY TUBING (MISCELLANEOUS) ×2
SET ARTHROSCOPY TUBING LN (MISCELLANEOUS) ×1 IMPLANT
SHAVER 4.2 MM LANZA 9391A (BLADE) ×3 IMPLANT
SUT ETHILON 3 0 PS 1 (SUTURE) ×3 IMPLANT
SYR CONTROL 10ML LL (SYRINGE) ×3 IMPLANT
TOWEL OR 17X24 6PK STRL BLUE (TOWEL DISPOSABLE) ×3 IMPLANT
TUBE CONNECTING 12'X1/4 (SUCTIONS) ×1
TUBE CONNECTING 12X1/4 (SUCTIONS) ×2 IMPLANT
WAND 30 DEG SABER W/CORD (SURGICAL WAND) IMPLANT
WATER STERILE IRR 500ML POUR (IV SOLUTION) ×3 IMPLANT

## 2017-02-20 NOTE — Anesthesia Procedure Notes (Signed)
Procedure Name: LMA Insertion Date/Time: 02/20/2017 12:27 PM Performed by: Wanita Chamberlain, CRNA Pre-anesthesia Checklist: Patient identified, Emergency Drugs available, Suction available, Patient being monitored and Timeout performed Patient Re-evaluated:Patient Re-evaluated prior to induction Oxygen Delivery Method: Circle system utilized Preoxygenation: Pre-oxygenation with 100% oxygen Induction Type: IV induction Ventilation: Mask ventilation without difficulty LMA: LMA with gastric port inserted LMA Size: 4.0 Number of attempts: 1 Placement Confirmation: positive ETCO2 and breath sounds checked- equal and bilateral Tube secured with: Tape Dental Injury: Teeth and Oropharynx as per pre-operative assessment  Comments: Small mouth

## 2017-02-20 NOTE — Brief Op Note (Signed)
02/20/2017  1:52 PM  PATIENT:  Joyce Sharp  56 y.o. female  PRE-OPERATIVE DIAGNOSIS:  Right knee medial meniscus tear, medial tibial plateau stress fracture  POST-OPERATIVE DIAGNOSIS:  Right knee medial meniscus tear, right knee lateral meniscus tear, right medial tibial plateau stress fracture  PROCEDURE:  Procedure(s) with comments: Right knee scope, partial medial menisectomy, medial tibial plateau subchondroplasty (Right) - 75 mins Partial lateral meniscectomy  SURGEON:  Surgeon(s) and Role:    * Nicholes Stairs, MD - Primary  PHYSICIAN ASSISTANT: None  ASSISTANTS: none   ANESTHESIA:   general  EBL:  50 mL   BLOOD ADMINISTERED:none  DRAINS: none   LOCAL MEDICATIONS USED:  MARCAINE     SPECIMEN:  No Specimen  DISPOSITION OF SPECIMEN:  N/A  COUNTS:  YES  TOURNIQUET:  * Missing tourniquet times found for documented tourniquets in log: [id number] *  DICTATION: .Note written in EPIC  PLAN OF CARE: Discharge to home after PACU  PATIENT DISPOSITION:  PACU - hemodynamically stable.   Delay start of Pharmacological VTE agent (>24hrs) due to surgical blood loss or risk of bleeding: not applicable

## 2017-02-20 NOTE — H&P (Signed)
ORTHOPAEDIC H and P  REQUESTING PHYSICIAN: Nicholes Stairs, MD  PCP:  Leanna Battles, MD  Chief Complaint: Right medial knee pain  HPI: Joyce Sharp is a 56 y.o. female who complains of many months now of right knee pain.  We have been managing her conservatively to help her get through this semester at school, as she does work for the [employer] system.  She has unfortunately failed conservative management of her medial meniscus tear as well as medial tibial plateau stress fracture.  She presents today for surgical management.  We have discussed this prior in my office and all questions are answered satisfactorily prior to today's appointment.  Past Medical History:  Diagnosis Date  . Acute medial meniscus tear    right  . Arthritis   . Complication of anesthesia    slow to awaken after anesthesia  . GERD (gastroesophageal reflux disease)    due to hiatal hernia  . History of cervical cancer Jun 21, 2003   s/p  TAH 03-08-2004  . History of hiatal hernia   . History of kidney stones Jun 20, 2005   passed on own   . HTN (hypertension) 04/05/2014  . Hypercholesteremia   . Hyperlipidemia   . Hypertension   . Hypothyroidism 04/05/2014  . Obesity 04/05/2014   Past Surgical History:  Procedure Laterality Date  . ABDOMINAL HYSTERECTOMY  03/08/2004   dr Ree Edman [facility]   cervcal ca  . COLD KNIFE CERVICAL CONIZATION  03-06-2004   dr Ree Edman  . COLONOSCOPY    . ENDOSCOPIC PLANTAR FASCIOTOMY Left 12/29/2008  . LEFT HEART CATHETERIZATION WITH CORONARY ANGIOGRAM N/A 04/06/2014   Procedure: LEFT HEART CATHETERIZATION WITH CORONARY ANGIOGRAM;  Surgeon: Wellington Hampshire, MD;  Location: [facility] CATH [facility];  Service: Cardiovascular;  Laterality: N/A;  normal coronaries; normal LVF, ef 60-65%   Social History   Socioeconomic History  . Marital status: Married    Spouse name: None  . Number of children: None  . Years of education: None  . Highest education level: None  Social Needs  .  Financial resource strain: None  . Food insecurity - worry: None  . Food insecurity - inability: None  . Transportation needs - medical: None  . Transportation needs - non-medical: None  Occupational History  . None  Tobacco Use  . Smoking status: Never Smoker  . Smokeless tobacco: Never Used  Substance and Sexual Activity  . Alcohol use: No  . Drug use: No  . Sexual activity: None  Other Topics Concern  . None  Social History Narrative  . None   Family History  Problem Relation Age of Onset  . Heart disease Father   . Hypertension Mother   . Hyperlipidemia Mother   . CAD Brother    Allergies  Allergen Reactions  . Invokana [Canagliflozin] Rash and Other (See Comments)    Dizzy spells    Prior to Admission medications   Medication Sig Start Date End Date Taking? Authorizing Provider  acetaminophen (TYLENOL) 500 MG tablet Take 1,000 mg by mouth every 6 (six) hours as needed for mild pain or headache.   Yes [provider]  levothyroxine (SYNTHROID, LEVOTHROID) 100 MCG tablet Take 100 mcg by mouth daily before breakfast.   Yes [provider]  losartan-hydrochlorothiazide (HYZAAR) 100-12.5 MG per tablet Take 1 tablet by mouth daily with breakfast.   Yes [provider]  meloxicam (MOBIC) 15 MG tablet Take 15 mg by mouth daily as needed.    Yes  [provider]  pantoprazole (PROTONIX) 40 MG tablet Take 1 tablet (40 mg total) by mouth daily. Future refills through Primary Care Physician 12/01/14  Yes Brett Canales, PA-C  propranolol (INDERAL) 10 MG tablet Take 10 mg by mouth 3 (three) times daily.   Yes [provider]  simvastatin (ZOCOR) 40 MG tablet Take 40 mg by mouth daily with breakfast.   Yes [provider]  loratadine (CLARITIN) 10 MG tablet Take 10 mg by mouth daily. 03/31/14   [provider]   No results found.  Positive ROS: All other systems have been reviewed and were otherwise negative with the  exception of those mentioned in the HPI and as above.  Physical Exam: General: Alert, no acute distress Cardiovascular: No pedal edema Respiratory: No cyanosis, no use of accessory musculature GI: No organomegaly, abdomen is soft and non-tender Skin: No lesions in the area of chief complaint Neurologic: Sensation intact distally Psychiatric: Patient is competent for consent with normal mood and affect Lymphatic: No axillary or cervical lymphadenopathy  MUSCULOSKELETAL:  Right lower extremity:  No overlying skin changes or concerning wounds.  Otherwise neurovascularly intact throughout.  She has exquisite tenderness on the medial joint line.  Assessment: 1.  Right knee medial meniscus tear.  2.  Right tibial plateau stress fracture  Plan: -Plan for arthroscopic intervention on the right knee today with partial medial meniscectomy and chondroplasties as indicated.  We will also move forward with a sub-chondroplasty of the medial tibial plateau. -The risks, benefits, and indications of this procedure were discussed with patient at length.  All questions were solicited and answered to her satisfaction. -We will plan on discharging her from PACU postoperatively to her home.  She may be weightbearing as tolerated with the assistance of crutches.  She will be on aspirin for 6 weeks for DVT prophylaxis.    Nicholes Stairs, MD Cell [phone]    02/20/2017 12:08 PM

## 2017-02-20 NOTE — Op Note (Signed)
Date: 02/20/2017  Surgeon(s): Nicholes Stairs, MD  ANESTHESIA:general  FLUIDS: Per anesthesia record.   ESTIMATED BLOOD LOSS: minimal   PREOPERATIVE DIAGNOSES:  1. Rightknee medialmeniscus tear 2. 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 Rightknee medialmeniscus tear 2. 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 Right knee lateral meniscus tear  PROCEDURES PERFORMED:  1. Right knee arthroscopically aided treatment ofmedial tibial plateauinsufficiency fracturewithpercutaneousinternal fixation (subchondroplasty)  2.Righttknee arthroscopy with arthroscopic partial medial AND Lateral partialmeniscectomies 3.Chondroplasty, Right medial femoral condyle  Implant: Flowable calcium phosphate, 5 mL.[place]  DESCRIPTION OF PROCEDURE:The patient has a Right knee medialmeniscus tear. They have had pain that has been refractory to conservative management. Their preoperative MRI demonstrated subchondral bone marrow edema and insufficiency fractures of theRight medial tibial plateau as well as the medial meniscus tear. Plans are to proceed with partial medialmeniscectomy, internal fixation of subchondral insufficiency fractures with flowable calcium phosphate,and diagnostic arthroscopy with debridement as indicated. Full discussion held regarding risks benefits alternatives and complications related surgical intervention. Conservative care options reviewed. All questions answered.  The patient was identified in the preoperative holding area and the operative extremity was marked. The patient was brought to the operating room and transferred to operating table in a supine position. Satisfactory general anesthesia was induced byanesthesiology. Preoperative antibiotics were  dosed prior to incision.  Time out was performed prior to incision.  Standard anterolateral, anteromedial arthroscopy portals were obtained. The anteromedial portal was obtained with a spinal needle for localization under direct visualization with subsequent diagnostic findings.   Anteromedial and anterolateral chambers: moderatesynovitis. The synovitis was debrided with a 4.5 mm full radius shaver through both the anteromedial and lateral portals.   Suprapatellar pouch and gutters:  Moderatesynovitis or debris. Patella chondral surface: Grade 3 Trochlear chondral surface: Grade3 Patellofemoral tracking: level Medial meniscus: posterior hornRadial tear with approximately 4 mm of diastasis Medial femoral condyle flexion bearing surface: Grade 1 Medial femoral condyle extension bearing surface: Grade 3 Medial tibial plateau: Grade2 Anterior cruciate ligament:stable Posterior cruciate ligament:stable Lateral meniscus: Degenerative tearing, parrot-beak of the posterior horn Lateral femoral condyle flexion bearing surface: Grade 1 Lateral femoral condyle extension bearing surface: Grade 0 Lateral tibial plateau: Grade 1  Medial meniscus tear And lateral meniscus tear were debrided using biters and motorized shaver alternating until a stable remnant was left. Upon completion the probe was used to evaluate and assess the remaining meniscus which was gleaned to be stable.  Chondroplasty was achieved on the medial femoral condyle and medial tibial plateau and trochlela using a motorized shaver to debride the grade 3 unstable cartilage. Completion of the chondroplasty left A medial femoral condyle with smooth stable surface. There was no full-thickness component noted.  Next we turned our attention to the internal fixation of themedial tibial plateau. Arthroscopically we evaluated themedial tibial condylenoted there was no loose cartilage or debris surrounding the lesion and the fracture  did not propagate to the joint surface. Using preoperative MRI we targeted thedelivery device to just under the subchondral density and in the anterior and far medial aspect of the medial tibia plateau. This was achieved with intraoperative fluoroscopy. Once accurate placement was noted on 2 views and confirmed we delivered44mL of flowable calcium phosphate into the lesion. We left the cannulas in place for approximately 8 minutes while the implant hardened. We removed the cannulas and again took 2 views of fluoroscopic pictures to confirm there was no  extravasation outside of the bone.  The lesion was noted to have been adequately filled on post fixation fluoroscopy.  After completion of synovectomy, diagnostic exam, and debridements as described, all compartments were checked and no residual debris remained. Hemostasis was achieved with the cautery wand. The portals were approximated withnylon suture. All excess fluid was expressed from the joint. Xeroform sterile gauze dressings were applied followed by Ace bandage and ice pack.   There were no immediate Complications and all counts were correct.    DISPOSITION:The patientwas awakened from general anesthetic, extubated, taken to the recovery room in medically stable condition, no apparent complications. The patient may be weightbearing as tolerated to the operativelower extremitywith crutches. Range of motion ofright knee as tolerated.  They will use Daily asa for DVT ppx For 6 weeks, and return in 2 weeks for suture removal.  Nicholes Stairs

## 2017-02-20 NOTE — Discharge Instructions (Signed)
Orthopedic discharge instructions:  -Okay for weightbearing as tolerated on the right leg, however for the first 48-72 hours you should use crutches when weightbearing.  You may use crutches and/or a walker as long as you feel weakness in the right leg.  - Maintain postoperative dressing for 3 days.  On the end of the third day he may remove the dressing and begin showering.  He should cover the incisions with Band-Aids until your follow-up appointment.  Do not submerge the wounds under water until seen by her surgeon.  -4 mild to moderate pain take Tylenol and/or ibuprofen.  4 severe pain take oxycodone. -Apply ice to the right knee 3-4 times per day for 20-30 minutes at a time. -For the prevention of blood clots take a 325 mg aspirin once a day for 6 weeks postoperatively. -Return to see Dr. Stann Mainland in 2 weeks for a wound check and suture removal.  ARTHROSCOPIC [place] will be expected to have a moderate amount of pain in the affected knee for approximately two weeks.  However, the first two to four days will be the most severe in terms of the pain you will experience.  Prescriptions have been provided for you to take as needed for the pain.  The pain can be markedly reduced by using the ice/compressive bandage given.  Exchange the ice packs whenever they thaw.  During the night, keep the bandage on because it will still provide some compression for the swelling.  Also, keep the leg elevated on pillows above your heart, and this will help alleviate the pain and swelling.  MEDICATION Prescriptions have been provided to take as needed for pain. To prevent blood clots, take Aspirin 325mg  daily with a meal if not on a blood thinner and if no history of stomach ulcers.  ACTIVITY It is preferred that you stay on bedrest for approximately 24 hours.  However, you may go to the bathroom with help.  After this, you can start to be up and about progressively more.   Remember that the swelling may still increase after three to four days if you are up and doing too much.  You may put as much weight on the affected leg as pain will allow.  Use your crutches for comfort and safety.  However, as soon as you are able, you may discard the crutches and go without them.   DRESSING Keep the current dressing as dry as possible.  3 days after your surgery, you may remove the ice/compressive wrap, and surgical dressing.  You may now take a shower, but do not scrub the sounds directly with soap.  Let water rinse over these and gently wipe with your hand.  Reapply band-aids over the puncture wounds and more gauze if needed.  A slight amount of thin drainage can be normal at this time, and do not let it frighten you.  Reapply the ice/compressive wrap.  You may now repeat this every day each time you shower.  SYMPTOMS TO REPORT TO YOUR DOCTOR  -Extreme pain.  -Extreme swelling.  -Temperature above 101 degrees that does not come down with acetaminophen     (Tylenol).  -Any changes in the feeling, color or movement of your toes.  -Extreme redness, heat, swelling or drainage at your incision  No advil, aleve, motrin, ibuprofen until 7 pm today  Post Anesthesia Home Care Instructions  Activity: Get plenty of rest for the remainder of the day. A responsible  individual must stay with you for 24 hours following the procedure.  For the next 24 hours, DO NOT: -Drive a car -Paediatric nurse -Drink alcoholic beverages -Take any medication unless instructed by your physician -Make any legal decisions or sign important papers.  Meals: Start with liquid foods such as gelatin or soup. Progress to regular foods as tolerated. Avoid greasy, spicy, heavy foods. If nausea and/or vomiting occur, drink only clear liquids until the nausea and/or vomiting subsides. Call your physician if vomiting continues.  Special Instructions/Symptoms: Your throat may feel dry or sore from the  anesthesia or the breathing tube placed in your throat during surgery. If this causes discomfort, gargle with warm salt water. The discomfort should disappear within 24 hours.  If you had a scopolamine patch placed behind your ear for the management of post- operative nausea and/or vomiting:  1. The medication in the patch is effective for 72 hours, after which it should be removed.  Wrap patch in a tissue and discard in the trash. Wash hands thoroughly with soap and water. 2. You may remove the patch earlier than 72 hours if you experience unpleasant side effects which may include dry mouth, dizziness or visual disturbances. 3. Avoid touching the patch. Wash your hands with soap and water after contact with the patch.

## 2017-02-20 NOTE — Transfer of Care (Signed)
Immediate Anesthesia Transfer of Care Note  Patient: Joyce Sharp  Procedure(s) Performed: Right knee scope, partial medial menisectomy, medial tibial plateau subchondroplasty (Right Knee)  Patient Location: PACU  Anesthesia Type:General  Level of Consciousness: awake, alert , oriented and patient cooperative  Airway & Oxygen Therapy: Patient Spontanous Breathing and Patient connected to nasal cannula oxygen  Post-op Assessment: Report given to RN and Post -op Vital signs reviewed and stable  Post vital signs: Reviewed and stable  Last Vitals:  Vitals:   02/20/17 1030  BP: 138/89  Pulse: 60  Resp: 20  Temp: 36.5 C  SpO2: 99%    Last Pain:  Vitals:   02/20/17 1030  TempSrc: Oral      Patients Stated Pain Goal: 3 (89/02/28 4069)  Complications: No apparent anesthesia complications

## 2017-02-20 NOTE — Anesthesia Preprocedure Evaluation (Signed)
Anesthesia Evaluation  Patient identified by MRN, date of birth, ID band Patient awake    Reviewed: Allergy & Precautions, NPO status , Patient's Chart, lab work & pertinent test results  Airway Mallampati: II  TM Distance: >3 FB Neck ROM: Full    Dental no notable dental hx.    Pulmonary neg pulmonary ROS,    Pulmonary exam normal breath sounds clear to auscultation       Cardiovascular hypertension, Pt. on medications Normal cardiovascular exam Rhythm:Regular Rate:Normal     Neuro/Psych negative neurological ROS  negative psych ROS   GI/Hepatic Neg liver ROS, hiatal hernia,   Endo/Other  Morbid obesity  Renal/GU negative Renal ROS  negative genitourinary   Musculoskeletal negative musculoskeletal ROS (+)   Abdominal   Peds negative pediatric ROS (+)  Hematology negative hematology ROS (+)   Anesthesia Other Findings   Reproductive/Obstetrics negative OB ROS                            Anesthesia Physical Anesthesia Plan  ASA: III  Anesthesia Plan: General   Post-op Pain Management:    Induction: Intravenous  PONV Risk Score and Plan: 3 and Dexamethasone, Ondansetron, Treatment may vary due to age or medical condition and Midazolam  Airway Management Planned: LMA  Additional Equipment:   Intra-op Plan:   Post-operative Plan: Extubation in OR  Informed Consent: I have reviewed the patients History and Physical, chart, labs and discussed the procedure including the risks, benefits and alternatives for the proposed anesthesia with the patient or authorized representative who has indicated his/her understanding and acceptance.   Dental advisory given  Plan Discussed with: CRNA  Anesthesia Plan Comments:         Anesthesia Quick Evaluation

## 2017-02-21 ENCOUNTER — Encounter (HOSPITAL_BASED_OUTPATIENT_CLINIC_OR_DEPARTMENT_OTHER): Payer: Self-pay | Admitting: Orthopedic Surgery

## 2017-02-21 NOTE — Anesthesia Postprocedure Evaluation (Signed)
Anesthesia Post Note  Patient: Joyce Sharp  Procedure(s) Performed: Right knee scope, partial medial menisectomy, medial tibial plateau subchondroplasty (Right Knee)     Patient location during evaluation: PACU Anesthesia Type: General Level of consciousness: awake and alert Pain management: pain level controlled Vital Signs Assessment: post-procedure vital signs reviewed and stable Respiratory status: spontaneous breathing, nonlabored ventilation, respiratory function stable and patient connected to nasal cannula oxygen Cardiovascular status: blood pressure returned to baseline and stable Postop Assessment: no apparent nausea or vomiting Anesthetic complications: no    Last Vitals:  Vitals:   02/20/17 1445 02/20/17 1541  BP: 138/84 137/72  Pulse: (!) 45 (!) 53  Resp: 13 18  Temp:  36.7 C  SpO2: 98% 97%    Last Pain:  Vitals:   02/21/17 1001  TempSrc:   PainSc: 2                  Montez Hageman

## 2017-10-03 ENCOUNTER — Other Ambulatory Visit: Payer: Self-pay | Admitting: Internal Medicine

## 2017-10-03 DIAGNOSIS — Z1231 Encounter for screening mammogram for malignant neoplasm of breast: Secondary | ICD-10-CM

## 2017-11-12 ENCOUNTER — Ambulatory Visit
Admission: RE | Admit: 2017-11-12 | Discharge: 2017-11-12 | Disposition: A | Discharge status: Home / Self Care | Payer: BC Managed Care – PPO | Source: Ambulatory Visit | Attending: Internal Medicine | Admitting: Internal Medicine

## 2017-11-12 DIAGNOSIS — Z1231 Encounter for screening mammogram for malignant neoplasm of breast: Secondary | ICD-10-CM

## 2018-01-09 ENCOUNTER — Emergency Department (HOSPITAL_COMMUNITY)
Admission: EM | Admit: 2018-01-09 | Discharge: 2018-01-09 | Disposition: A | Discharge status: Home / Self Care | Payer: BC Managed Care – PPO | Attending: Emergency Medicine | Admitting: Emergency Medicine

## 2018-01-09 ENCOUNTER — Encounter (HOSPITAL_COMMUNITY): Payer: Self-pay | Admitting: Emergency Medicine

## 2018-01-09 ENCOUNTER — Emergency Department (HOSPITAL_COMMUNITY): Payer: BC Managed Care – PPO

## 2018-01-09 ENCOUNTER — Other Ambulatory Visit: Payer: Self-pay

## 2018-01-09 DIAGNOSIS — R112 Nausea with vomiting, unspecified: Secondary | ICD-10-CM | POA: Insufficient documentation

## 2018-01-09 DIAGNOSIS — Z8541 Personal history of malignant neoplasm of cervix uteri: Secondary | ICD-10-CM | POA: Diagnosis not present

## 2018-01-09 DIAGNOSIS — R252 Cramp and spasm: Secondary | ICD-10-CM | POA: Insufficient documentation

## 2018-01-09 DIAGNOSIS — E039 Hypothyroidism, unspecified: Secondary | ICD-10-CM | POA: Diagnosis not present

## 2018-01-09 DIAGNOSIS — R531 Weakness: Secondary | ICD-10-CM | POA: Insufficient documentation

## 2018-01-09 DIAGNOSIS — K5792 Diverticulitis of intestine, part unspecified, without perforation or abscess without bleeding: Secondary | ICD-10-CM | POA: Diagnosis not present

## 2018-01-09 DIAGNOSIS — I10 Essential (primary) hypertension: Secondary | ICD-10-CM | POA: Insufficient documentation

## 2018-01-09 DIAGNOSIS — R1030 Lower abdominal pain, unspecified: Secondary | ICD-10-CM | POA: Diagnosis present

## 2018-01-09 DIAGNOSIS — R5381 Other malaise: Secondary | ICD-10-CM | POA: Insufficient documentation

## 2018-01-09 DIAGNOSIS — Z79899 Other long term (current) drug therapy: Secondary | ICD-10-CM | POA: Insufficient documentation

## 2018-01-09 LAB — URINALYSIS, ROUTINE W REFLEX MICROSCOPIC
Bilirubin Urine: NEGATIVE
GLUCOSE, UA: NEGATIVE mg/dL
KETONES UR: 20 mg/dL — AB
Leukocytes, UA: NEGATIVE
Nitrite: NEGATIVE
PH: 5 (ref 5.0–8.0)
Protein, ur: 30 mg/dL — AB
SPECIFIC GRAVITY, URINE: 1.021 (ref 1.005–1.030)

## 2018-01-09 LAB — COMPREHENSIVE METABOLIC PANEL
ALK PHOS: 57 U/L (ref 38–126)
ALT: 25 U/L (ref 0–44)
AST: 24 U/L (ref 15–41)
Albumin: 4.5 g/dL (ref 3.5–5.0)
Anion gap: 13 (ref 5–15)
BILIRUBIN TOTAL: 1.1 mg/dL (ref 0.3–1.2)
BUN: 21 mg/dL — ABNORMAL HIGH (ref 6–20)
CALCIUM: 9.9 mg/dL (ref 8.9–10.3)
CO2: 24 mmol/L (ref 22–32)
Chloride: 103 mmol/L (ref 98–111)
Creatinine, Ser: 0.97 mg/dL (ref 0.44–1.00)
GFR calc non Af Amer: >60 mL/min (ref >60)
Glucose, Bld: 132 mg/dL — ABNORMAL HIGH (ref 70–99)
Potassium: 3.5 mmol/L (ref 3.5–5.1)
Sodium: 140 mmol/L (ref 135–145)
TOTAL PROTEIN: 8 g/dL (ref 6.5–8.1)

## 2018-01-09 LAB — CBC
HCT: 42.3 % (ref 36.0–46.0)
Hemoglobin: 13.3 g/dL (ref 12.0–15.0)
MCH: 30.2 pg (ref 26.0–34.0)
MCHC: 31.4 g/dL (ref 30.0–36.0)
MCV: 96.1 fL (ref 80.0–100.0)
PLATELETS: 206 10*3/uL (ref 150–400)
RBC: 4.4 MIL/uL (ref 3.87–5.11)
RDW: 13.7 % (ref 11.5–15.5)
WBC: 9.6 10*3/uL (ref 4.0–10.5)
nRBC: 0 % (ref 0.0–0.2)

## 2018-01-09 LAB — LIPASE, BLOOD: Lipase: 25 U/L (ref 11–51)

## 2018-01-09 MED ORDER — IOPAMIDOL (ISOVUE-300) INJECTION 61%
INTRAVENOUS | Status: AC
  Filled 2018-01-09: qty 100

## 2018-01-09 MED ORDER — METRONIDAZOLE 500 MG PO TABS
500.0000 mg | ORAL_TABLET | Freq: Once | ORAL | Status: AC
  Administered 2018-01-09: 500 mg via ORAL
  Filled 2018-01-09: qty 1

## 2018-01-09 MED ORDER — METRONIDAZOLE 500 MG PO TABS: 500.0000 mg | ORAL_TABLET | Freq: Three times a day (TID) | ORAL | 0 refills | Status: DC

## 2018-01-09 MED ORDER — IOPAMIDOL (ISOVUE-300) INJECTION 61%
100.0000 mL | Freq: Once | INTRAVENOUS | Status: AC | PRN
  Administered 2018-01-09: 100 mL via INTRAVENOUS

## 2018-01-09 MED ORDER — SODIUM CHLORIDE 0.9 % IV BOLUS
1000.0000 mL | Freq: Once | INTRAVENOUS | Status: AC
  Administered 2018-01-09: 1000 mL via INTRAVENOUS

## 2018-01-09 MED ORDER — CIPROFLOXACIN HCL 500 MG PO TABS
500.0000 mg | ORAL_TABLET | Freq: Once | ORAL | Status: AC
  Administered 2018-01-09: 500 mg via ORAL
  Filled 2018-01-09: qty 1

## 2018-01-09 MED ORDER — MORPHINE SULFATE (PF) 4 MG/ML IV SOLN
4.0000 mg | Freq: Once | INTRAVENOUS | Status: AC
  Administered 2018-01-09: 4 mg via INTRAVENOUS
  Filled 2018-01-09: qty 1

## 2018-01-09 MED ORDER — CIPROFLOXACIN HCL 500 MG PO TABS: 500.0000 mg | ORAL_TABLET | Freq: Two times a day (BID) | ORAL | 0 refills | Status: DC

## 2018-01-09 MED ORDER — HYDROCODONE-ACETAMINOPHEN 5-325 MG PO TABS: 1.0000 | ORAL_TABLET | ORAL | 0 refills | Status: DC | PRN

## 2018-01-09 MED ORDER — KETOROLAC TROMETHAMINE 30 MG/ML IJ SOLN
15.0000 mg | Freq: Once | INTRAMUSCULAR | Status: AC
  Administered 2018-01-09: 15 mg via INTRAVENOUS
  Filled 2018-01-09: qty 1

## 2018-01-09 MED ORDER — SODIUM CHLORIDE 0.9 % IJ SOLN
INTRAMUSCULAR | Status: AC
  Filled 2018-01-09: qty 50

## 2018-01-09 MED ORDER — ONDANSETRON HCL 4 MG/2ML IJ SOLN
4.0000 mg | Freq: Once | INTRAMUSCULAR | Status: AC
  Administered 2018-01-09: 4 mg via INTRAVENOUS
  Filled 2018-01-09: qty 2

## 2018-01-09 NOTE — ED Triage Notes (Signed)
Pt from home with c/o lower abdominal cramping. Pt states she had 1 episode of emesis today. Pt states she feels constipated but had a BM yesterday. Pt denies mucous or blood

## 2018-01-09 NOTE — ED Notes (Signed)
ED Provider at bedside. 

## 2018-01-09 NOTE — ED Provider Notes (Signed)
[facility] DEPT Provider Note   CSN: [id number] Arrival date & time: 01/09/18  0344     History   Chief Complaint Chief Complaint  Patient presents with  . Abdominal Cramping    HPI Joyce Sharp is a 57 y.o. female.  Patient presents to the emergency department for evaluation of abdominal pain.  Patient reports that symptoms began 4 days ago.  She started to feel weak, had a poor appetite.  She had a come home from work because she developed nausea and vomiting while at work.  The next 2 days she continued to have nausea and malaise.  Yesterday she started having increased symptoms of pain.  She reports pain in the low abdomen and left lower abdomen.  This is an intermittent sharp and crampy pain.  Last night she started having feeling like she needed to have a bowel movement, but could not.  She did, however, have a normal bowel movement the day before, no history of constipation.     Past Medical History:  Diagnosis Date  . Acute medial meniscus tear    right  . Arthritis   . Complication of anesthesia    slow to awaken after anesthesia  . GERD (gastroesophageal reflux disease)    due to hiatal hernia  . History of cervical cancer June 16, 2003   s/p  TAH 03-08-2004  . History of hiatal hernia   . History of kidney stones 06/15/2005   passed on own   . HTN (hypertension) 04/05/2014  . Hypercholesteremia   . Hyperlipidemia   . Hypertension   . Hypothyroidism 04/05/2014  . Obesity 04/05/2014    Patient Active Problem List   Diagnosis Date Noted  . Acute medial meniscus tear of right knee 02/20/2017  . Acute lateral meniscus tear of right knee 02/20/2017  . Acute chest pain 04/05/2014  . HTN (hypertension) 04/05/2014  . Hyperlipidemia 04/05/2014  . Obesity 04/05/2014  . Transaminitis 04/05/2014  . Hypothyroidism 04/05/2014  . Chest pain 04/05/2014  . Hyperglycemia     Past Surgical History:  Procedure Laterality Date  . ABDOMINAL  HYSTERECTOMY  03/08/2004   dr Ree Edman [facility]   cervcal ca  . COLD KNIFE CERVICAL CONIZATION  03-06-2004   dr Ree Edman  . COLONOSCOPY    . ENDOSCOPIC PLANTAR FASCIOTOMY Left 12/29/2008  . KNEE ARTHROSCOPY WITH SUBCHONDROPLASTY Right 02/20/2017   Procedure: Right knee scope, partial medial menisectomy, medial tibial plateau subchondroplasty;  Surgeon: Nicholes Stairs, MD;  Location: [facility];  Service: Orthopedics;  Laterality: Right;  75 mins  . LEFT HEART CATHETERIZATION WITH CORONARY ANGIOGRAM N/A 04/06/2014   Procedure: LEFT HEART CATHETERIZATION WITH CORONARY ANGIOGRAM;  Surgeon: Wellington Hampshire, MD;  Location: [facility] CATH [facility];  Service: Cardiovascular;  Laterality: N/A;  normal coronaries; normal LVF, ef 60-65%     OB History   None      Home Medications    Prior to Admission medications   Medication Sig Start Date End Date Taking? Authorizing Provider  acetaminophen (TYLENOL) 500 MG tablet Take 1,000 mg by mouth every 6 (six) hours as needed for mild pain or headache.   Yes [provider]  levothyroxine (SYNTHROID, LEVOTHROID) 100 MCG tablet Take 100 mcg by mouth daily before breakfast.   Yes [provider]  losartan-hydrochlorothiazide (HYZAAR) 100-12.5 MG per tablet Take 1 tablet by mouth daily with breakfast.   Yes [provider]  meloxicam (MOBIC) 15 MG tablet Take 15 mg by mouth daily  as needed for pain.    Yes [provider]  Multiple Vitamins-Minerals (MULTI ADULT GUMMIES) CHEW Chew 2 tablets by mouth daily.   Yes [provider]  pantoprazole (PROTONIX) 40 MG tablet Take 1 tablet (40 mg total) by mouth daily. Future refills through Primary Care Physician 12/01/14  Yes Brett Canales, PA-C  simvastatin (ZOCOR) 40 MG tablet Take 40 mg by mouth daily with breakfast.   Yes [provider]  ciprofloxacin (CIPRO) 500 MG tablet Take 1 tablet (500 mg total) by mouth 2 (two) times daily. 01/09/18   Orpah Greek, MD  HYDROcodone-acetaminophen (NORCO/VICODIN) 5-325 MG tablet Take 1-2 tablets by mouth every 4 (four) hours as needed for moderate pain. 01/09/18   Orpah Greek, MD  metroNIDAZOLE (FLAGYL) 500 MG tablet Take 1 tablet (500 mg total) by mouth 3 (three) times daily. 01/09/18   Orpah Greek, MD  ondansetron (ZOFRAN ODT) 4 MG disintegrating tablet Take 1 tablet (4 mg total) by mouth every 8 (eight) hours as needed for nausea or vomiting. Patient not taking: Reported on 01/09/2018 02/20/17   Nicholes Stairs, MD  oxyCODONE (ROXICODONE) 5 MG immediate release tablet Take 1 tablet (5 mg total) by mouth every 4 (four) hours as needed. Patient not taking: Reported on 01/09/2018 02/20/17 02/20/18  Nicholes Stairs, MD    Family History Family History  Problem Relation Age of Onset  . Heart disease Father   . Hypertension Mother   . Hyperlipidemia Mother   . CAD Brother     Social History Social History   Tobacco Use  . Smoking status: Never Smoker  . Smokeless tobacco: Never Used  Substance Use Topics  . Alcohol use: No  . Drug use: No     Allergies   Invokana [canagliflozin]   Review of Systems Review of Systems  Gastrointestinal: Positive for abdominal pain, nausea and vomiting.  Genitourinary: Negative for dysuria, flank pain and frequency.  Musculoskeletal: Negative for back pain.  All other systems reviewed and are negative.    Physical Exam Updated Vital Signs BP (!) 149/77   Pulse (!) 59   Temp 97.9 F (36.6 C)   Resp 19   SpO2 99%   Physical Exam  Constitutional: She is oriented to person, place, and time. She appears well-developed and well-nourished. No distress.  HENT:  Head: Normocephalic and atraumatic.  Right Ear: Hearing normal.  Left Ear: Hearing normal.  Nose: Nose normal.  Mouth/Throat: Oropharynx is clear and moist and mucous membranes are normal.  Eyes: Pupils are equal, round, and reactive to light.  Conjunctivae and EOM are normal.  Neck: Normal range of motion. Neck supple.  Cardiovascular: Regular rhythm, S1 normal and S2 normal. Exam reveals no gallop and no friction rub.  No murmur heard. Pulmonary/Chest: Effort normal and breath sounds normal. No respiratory distress. She exhibits no tenderness.  Abdominal: Soft. Normal appearance and bowel sounds are normal. There is no hepatosplenomegaly. There is tenderness in the suprapubic area and left lower quadrant. There is no rebound, no guarding, no tenderness at McBurney's point and negative Murphy's sign. No hernia.  Musculoskeletal: Normal range of motion.  Neurological: She is alert and oriented to person, place, and time. She has normal strength. No cranial nerve deficit or sensory deficit. Coordination normal. GCS eye subscore is 4. GCS verbal subscore is 5. GCS motor subscore is 6.  Skin: Skin is warm, dry and intact. No rash noted. No cyanosis.  Psychiatric: She has a  normal mood and affect. Her speech is normal and behavior is normal. Thought content normal.  Nursing note and vitals reviewed.    ED Treatments / Results  Labs (all labs ordered are listed, but only abnormal results are displayed) Labs Reviewed  COMPREHENSIVE METABOLIC PANEL - Abnormal; Notable for the following components:      Result Value   Glucose, Bld 132 (*)    BUN 21 (*)    All other components within normal limits  URINALYSIS, ROUTINE W REFLEX MICROSCOPIC - Abnormal; Notable for the following components:   APPearance CLOUDY (*)    Hgb urine dipstick LARGE (*)    Ketones, ur 20 (*)    Protein, ur 30 (*)    RBC / HPF >50 (*)    Bacteria, UA RARE (*)    All other components within normal limits  LIPASE, BLOOD  CBC    EKG None  Radiology Ct Abdomen Pelvis W Contrast  Result Date: 01/09/2018 CLINICAL DATA:  Lower abdominal pain.  Microhematuria. EXAM: CT ABDOMEN AND PELVIS WITH CONTRAST TECHNIQUE: Multidetector CT imaging of the abdomen and  pelvis was performed using the standard protocol following bolus administration of intravenous contrast. CONTRAST:  143mL ISOVUE-300 IOPAMIDOL (ISOVUE-300) INJECTION 61% COMPARISON:  CT scan of August 18, 2004. FINDINGS: Lower chest: No acute abnormality. Hepatobiliary: No focal liver abnormality is seen. No gallstones, gallbladder wall thickening, or biliary dilatation. Pancreas: Unremarkable. No pancreatic ductal dilatation or surrounding inflammatory changes. Spleen: Normal in size without focal abnormality. Adrenals/Urinary Tract: Adrenal glands appear normal. Bilateral renal cysts are noted. No hydronephrosis or renal obstruction is noted. Urinary bladder is unremarkable. No renal or ureteral calculi are noted. Stomach/Bowel: The stomach and appendix appear normal. There is no evidence of bowel obstruction. Focal diverticulitis of sigmoid colon is noted. No definite abscess is noted. Vascular/Lymphatic: No significant vascular findings are present. No enlarged abdominal or pelvic lymph nodes. Reproductive: Status post hysterectomy. No adnexal masses. Other: No abdominal wall hernia or abnormality. No abdominopelvic ascites. Musculoskeletal: No acute or significant osseous findings. IMPRESSION: Focal sigmoid diverticulitis is noted.  No abscess is noted. No hydronephrosis or renal obstruction is noted. No renal or ureteral calculi are noted. Electronically Signed   By: Marijo Conception, M.D.   On: 01/09/2018 07:42    Procedures Procedures (including critical care time)  Medications Ordered in ED Medications  iopamidol (ISOVUE-300) 61 % injection (has no administration in time range)  sodium chloride 0.9 % injection (has no administration in time range)  ketorolac (TORADOL) 30 MG/ML injection 15 mg (has no administration in time range)  morphine 4 MG/ML injection 4 mg (has no administration in time range)  ciprofloxacin (CIPRO) tablet 500 mg (has no administration in time range)  metroNIDAZOLE (FLAGYL)  tablet 500 mg (has no administration in time range)  sodium chloride 0.9 % bolus 1,000 mL (0 mLs Intravenous Stopped 01/09/18 0611)  ondansetron (ZOFRAN) injection 4 mg (4 mg Intravenous Given 01/09/18 0511)  morphine 4 MG/ML injection 4 mg (4 mg Intravenous Given 01/09/18 0511)  iopamidol (ISOVUE-300) 61 % injection 100 mL (100 mLs Intravenous Contrast Given 01/09/18 0653)     Initial Impression / Assessment and Plan / ED Course  I have reviewed the triage vital signs and the nursing notes.  Pertinent labs & imaging results that were available during my care of the patient were reviewed by me and considered in my medical decision making (see chart for details).     Patient presents to the emergency  department for evaluation of abdominal pain.  Patient complaining of low abdominal and left sided abdominal pain for several days.  Patient reports that at one point in the past she was presumptively treated for diverticulitis because of left lower quadrant abdominal pain, but never had definitive diagnosis.  This felt different today.  She has had a history of a kidney stone as well.  Patient did have blood in her urine.  CT scan shows sigmoid diverticulitis without complicated features, no kidney stone.  Further discussion with the patient reveals that she has had persistent hematuria for years, follows with [facility].  She has had a colonoscopy, she is not sure who her GI doctor is.  Will treat with Cipro, Flagyl, analgesia, follow-up outpatient with GI.  Return if symptoms worsen.  Final Clinical Impressions(s) / ED Diagnoses   Final diagnoses:  Diverticulitis    ED Discharge Orders         Ordered    ciprofloxacin (CIPRO) 500 MG tablet  2 times daily     01/09/18 0755    metroNIDAZOLE (FLAGYL) 500 MG tablet  3 times daily     01/09/18 0755    HYDROcodone-acetaminophen (NORCO/VICODIN) 5-325 MG tablet  Every 4 hours PRN     01/09/18 0755           Orpah Greek,  MD 01/09/18 [phone]

## 2018-04-22 ENCOUNTER — Other Ambulatory Visit: Payer: Self-pay | Admitting: Internal Medicine

## 2018-04-22 DIAGNOSIS — R1032 Left lower quadrant pain: Secondary | ICD-10-CM

## 2018-11-13 ENCOUNTER — Other Ambulatory Visit: Payer: Self-pay | Admitting: Internal Medicine

## 2018-11-13 DIAGNOSIS — Z1231 Encounter for screening mammogram for malignant neoplasm of breast: Secondary | ICD-10-CM

## 2018-12-29 ENCOUNTER — Ambulatory Visit: Payer: BC Managed Care – PPO

## 2019-01-07 ENCOUNTER — Other Ambulatory Visit: Payer: Self-pay

## 2019-01-07 DIAGNOSIS — Z20822 Contact with and (suspected) exposure to covid-19: Secondary | ICD-10-CM

## 2019-01-09 LAB — NOVEL CORONAVIRUS, NAA: SARS-CoV-2, NAA: NOT DETECTED

## 2019-02-15 ENCOUNTER — Ambulatory Visit: Payer: BC Managed Care – PPO

## 2019-04-07 ENCOUNTER — Other Ambulatory Visit: Payer: Self-pay

## 2019-04-07 ENCOUNTER — Ambulatory Visit
Admission: RE | Admit: 2019-04-07 | Discharge: 2019-04-07 | Disposition: A | Discharge status: Home / Self Care | Payer: BC Managed Care – PPO | Source: Ambulatory Visit | Attending: Internal Medicine | Admitting: Internal Medicine

## 2019-04-07 DIAGNOSIS — Z1231 Encounter for screening mammogram for malignant neoplasm of breast: Secondary | ICD-10-CM

## 2020-04-05 ENCOUNTER — Other Ambulatory Visit: Payer: Self-pay | Admitting: Internal Medicine

## 2020-04-05 DIAGNOSIS — Z1231 Encounter for screening mammogram for malignant neoplasm of breast: Secondary | ICD-10-CM

## 2020-05-18 ENCOUNTER — Inpatient Hospital Stay: Admission: RE | Admit: 2020-05-18 | Payer: BC Managed Care – PPO | Source: Ambulatory Visit

## 2020-07-10 ENCOUNTER — Ambulatory Visit: Payer: Self-pay

## 2020-08-31 ENCOUNTER — Ambulatory Visit
Admission: RE | Admit: 2020-08-31 | Discharge: 2020-08-31 | Disposition: A | Discharge status: Home / Self Care | Payer: PRIVATE HEALTH INSURANCE | Source: Ambulatory Visit | Attending: Internal Medicine | Admitting: Internal Medicine

## 2020-08-31 ENCOUNTER — Other Ambulatory Visit: Payer: Self-pay

## 2020-08-31 DIAGNOSIS — Z1231 Encounter for screening mammogram for malignant neoplasm of breast: Secondary | ICD-10-CM

## 2021-06-29 ENCOUNTER — Other Ambulatory Visit: Payer: Self-pay | Admitting: Internal Medicine

## 2021-06-29 DIAGNOSIS — R1314 Dysphagia, pharyngoesophageal phase: Secondary | ICD-10-CM

## 2021-07-05 IMAGING — MG MM DIGITAL SCREENING BILAT W/ TOMO AND CAD
8 series · 8 of 24 positions shown · non-contrast
Comparison: Previous exam(s).

CLINICAL DATA: Screening.

EXAM:
DIGITAL SCREENING BILATERAL MAMMOGRAM WITH TOMOSYNTHESIS AND CAD
TECHNIQUE: Bilateral screening digital craniocaudal and mediolateral oblique
mammograms were obtained. Bilateral screening digital breast
tomosynthesis was performed. The images were evaluated with
computer-aided detection.

[R CC synth-2D]
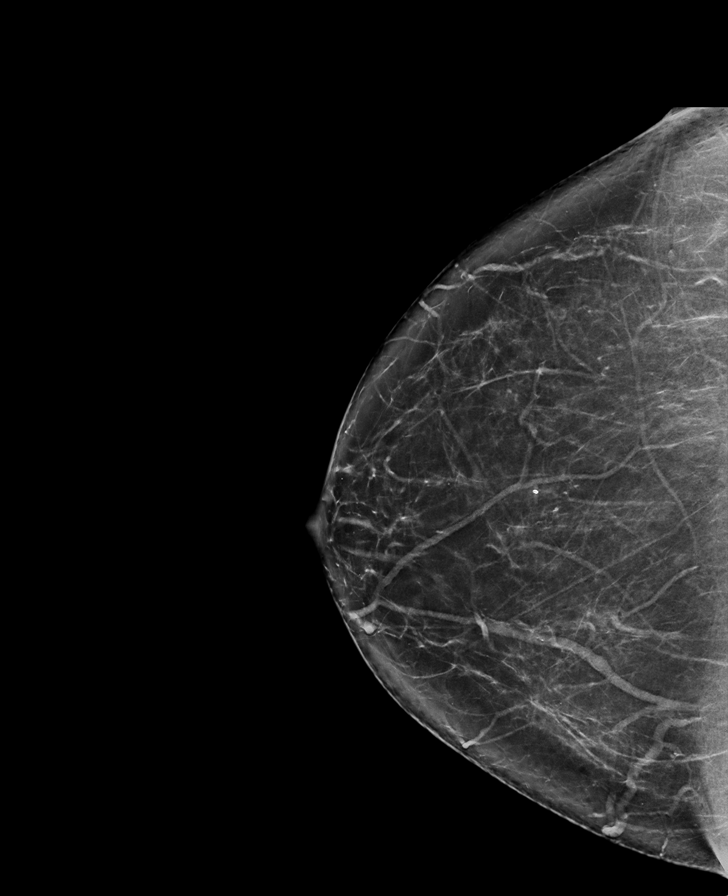

[L CC synth-2D]
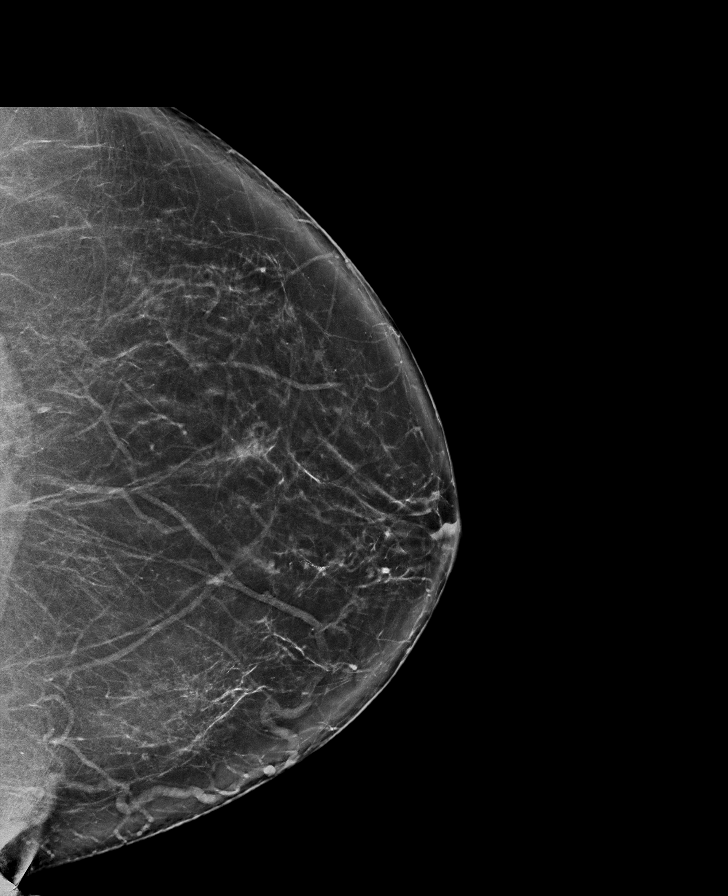

[L MLO synth-2D]
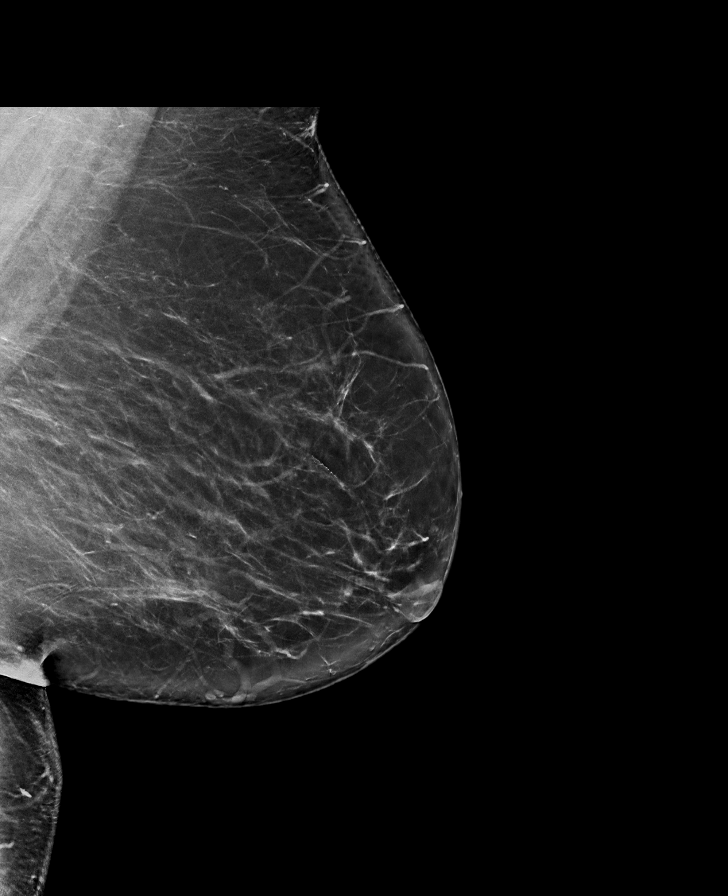

[R MLO synth-2D]
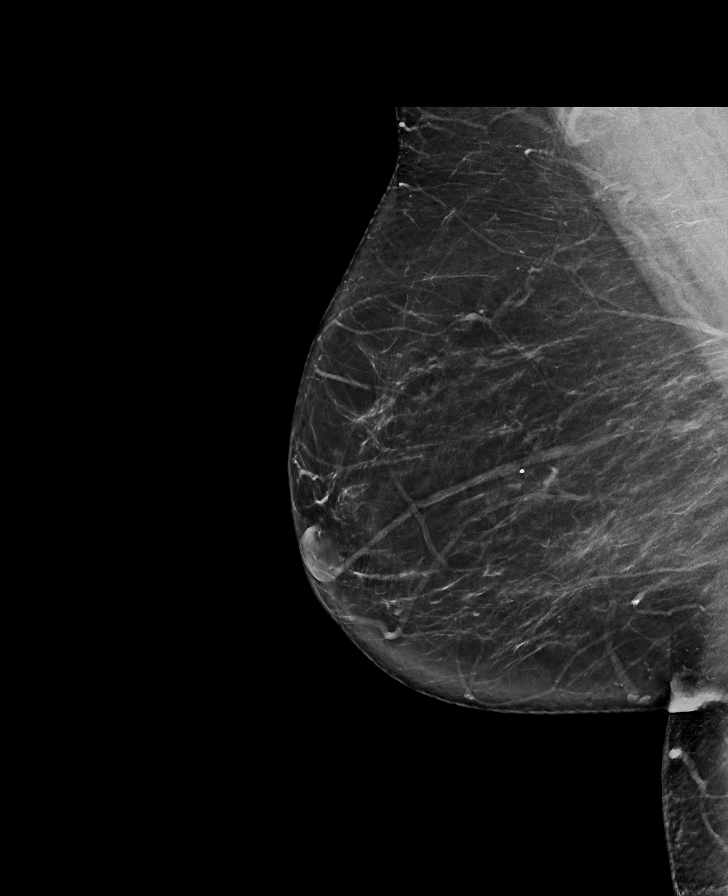

[L MLO tomo · tomo slice 45/89.0]
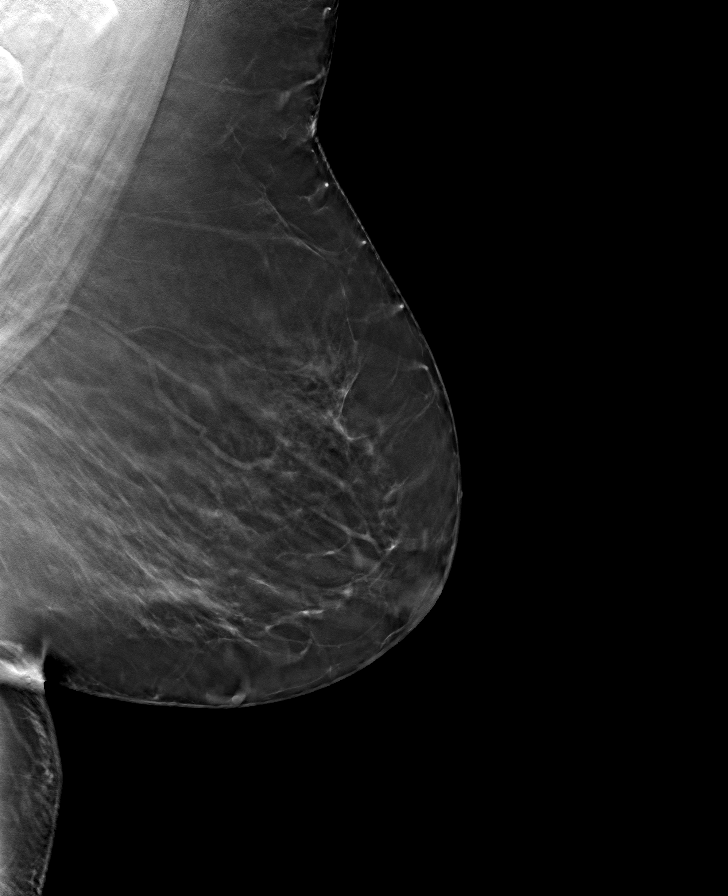

[R CC tomo · tomo slice 40/79.0]
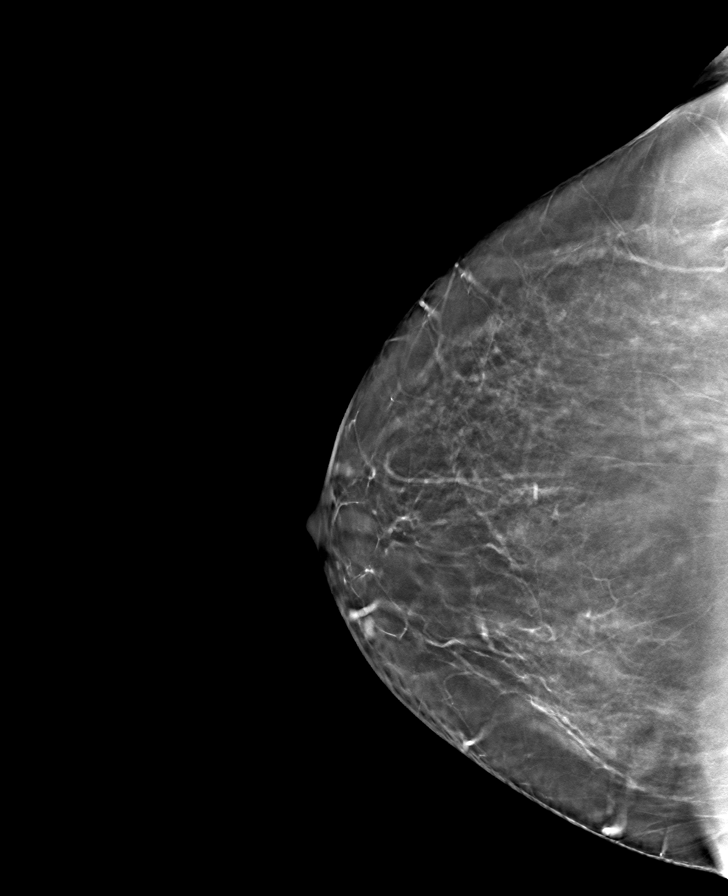

[L CC tomo · tomo slice 41/82.0]
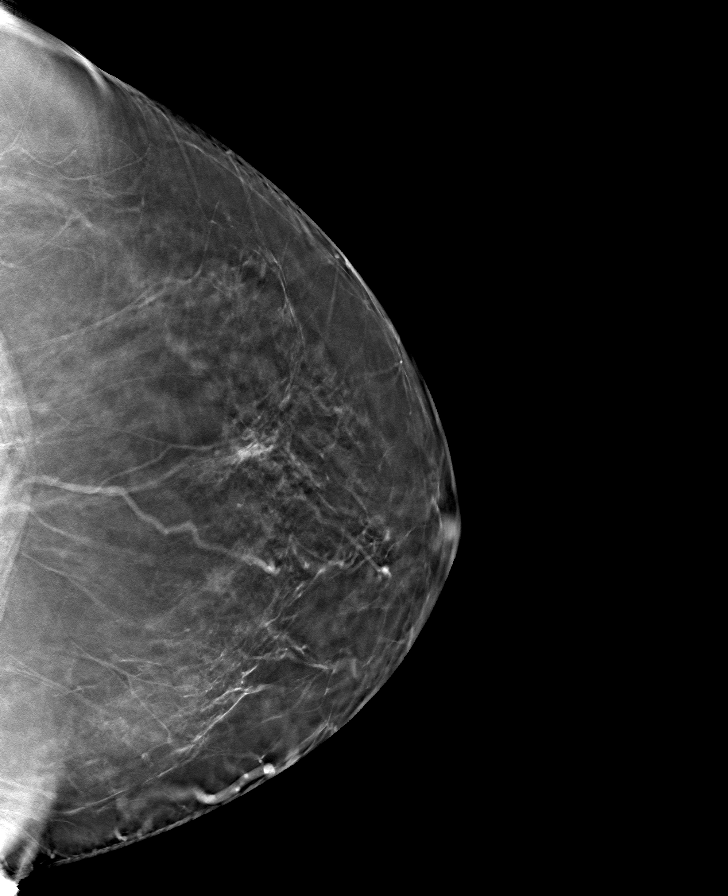

[R MLO tomo · tomo slice 43/85.0]
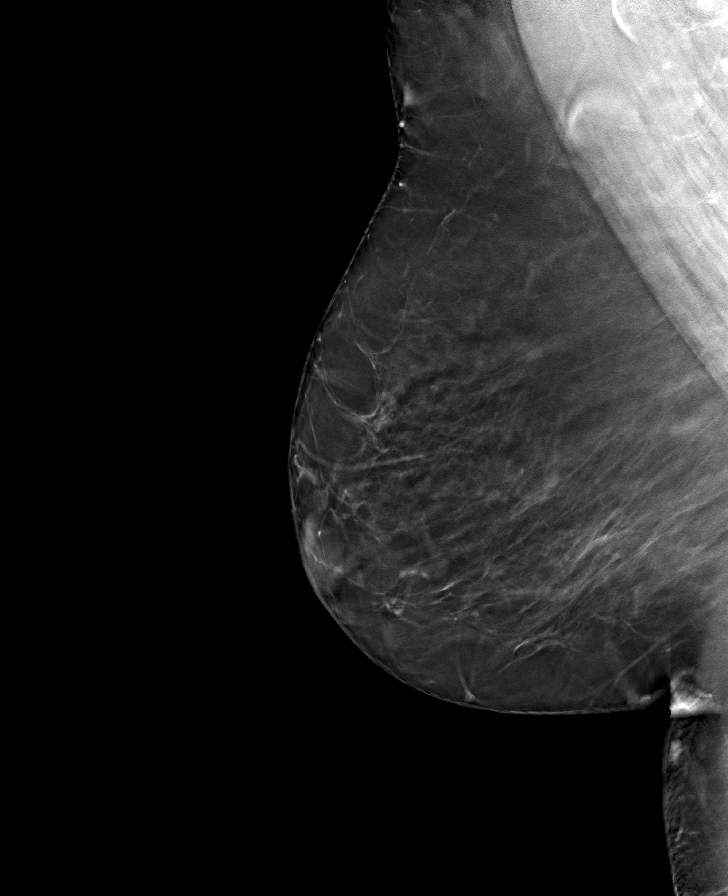

[8 of 24 positions shown; findings below may reference images not displayed]

ACR Breast Density Category b: There are scattered areas of
fibroglandular density.
FINDINGS: There are no findings suspicious for malignancy.
IMPRESSION: No mammographic evidence of malignancy. A result letter of this
screening mammogram will be mailed directly to the patient.

RECOMMENDATION:
Screening mammogram in one year. (Code:51-O-LD2)

BI-RADS CATEGORY  1: Negative.

## 2021-10-01 ENCOUNTER — Other Ambulatory Visit: Payer: Self-pay | Admitting: Internal Medicine

## 2021-10-01 DIAGNOSIS — Z1231 Encounter for screening mammogram for malignant neoplasm of breast: Secondary | ICD-10-CM

## 2021-10-19 ENCOUNTER — Ambulatory Visit: Payer: BC Managed Care – PPO

## 2021-10-30 ENCOUNTER — Ambulatory Visit: Payer: BC Managed Care – PPO

## 2021-12-10 ENCOUNTER — Ambulatory Visit: Payer: BC Managed Care – PPO

## 2021-12-13 ENCOUNTER — Encounter: Payer: Self-pay | Admitting: Gastroenterology

## 2021-12-25 ENCOUNTER — Ambulatory Visit: Payer: BC Managed Care – PPO

## 2021-12-31 ENCOUNTER — Encounter: Payer: Self-pay | Admitting: Gastroenterology

## 2022-01-10 ENCOUNTER — Ambulatory Visit (AMBULATORY_SURGERY_CENTER): Payer: Self-pay

## 2022-01-10 VITALS — Ht 63.0 in | Wt 241.0 lb

## 2022-01-10 DIAGNOSIS — Z1211 Encounter for screening for malignant neoplasm of colon: Secondary | ICD-10-CM

## 2022-01-10 MED ORDER — NA SULFATE-K SULFATE-MG SULF 17.5-3.13-1.6 GM/177ML PO SOLN: 1.0000 | ORAL | 0 refills | Status: DC

## 2022-01-10 NOTE — Progress Notes (Signed)
No egg or soy allergy known to patient  No issues known to pt with past sedation with any surgeries or procedures Patient denies ever being told they had issues or difficulty with intubation  No FH of Malignant Hyperthermia Pt is not on diet pills Pt is not on  home 02  Pt is not on blood thinners  Pt denies issues with constipation  No A fib or A flutter Have any cardiac testing pending--denied Pt instructed to use [email or web address] or GoodRx for a price reduction on prep   Patient's chart reviewed by Osvaldo Angst CNRA prior to previsit and patient appropriate for the [facility].  Previsit completed and red dot placed by patient's name on their procedure day (on provider's schedule).

## 2022-01-15 ENCOUNTER — Ambulatory Visit
Admission: RE | Admit: 2022-01-15 | Discharge: 2022-01-15 | Disposition: A | Discharge status: Home / Self Care | Payer: BC Managed Care – PPO | Source: Ambulatory Visit | Attending: Internal Medicine | Admitting: Internal Medicine

## 2022-01-15 ENCOUNTER — Encounter: Payer: Self-pay | Admitting: Gastroenterology

## 2022-01-15 DIAGNOSIS — Z1231 Encounter for screening mammogram for malignant neoplasm of breast: Secondary | ICD-10-CM

## 2022-01-25 ENCOUNTER — Ambulatory Visit (AMBULATORY_SURGERY_CENTER): Payer: BC Managed Care – PPO | Admitting: Gastroenterology

## 2022-01-25 ENCOUNTER — Encounter: Payer: Self-pay | Admitting: Gastroenterology

## 2022-01-25 VITALS — BP 113/65 | HR 62 | Temp 98.0°F | Resp 15 | Ht 63.0 in | Wt 241.0 lb

## 2022-01-25 DIAGNOSIS — D123 Benign neoplasm of transverse colon: Secondary | ICD-10-CM

## 2022-01-25 DIAGNOSIS — Z1211 Encounter for screening for malignant neoplasm of colon: Secondary | ICD-10-CM

## 2022-01-25 DIAGNOSIS — Z09 Encounter for follow-up examination after completed treatment for conditions other than malignant neoplasm: Secondary | ICD-10-CM | POA: Diagnosis present

## 2022-01-25 DIAGNOSIS — Z86010 Personal history of colonic polyps: Secondary | ICD-10-CM

## 2022-01-25 DIAGNOSIS — K635 Polyp of colon: Secondary | ICD-10-CM | POA: Diagnosis not present

## 2022-01-25 MED ORDER — SODIUM CHLORIDE 0.9 % IV SOLN: 500.0000 mL | INTRAVENOUS | Status: DC

## 2022-01-25 NOTE — Progress Notes (Signed)
Referring Provider: Donnajean Lopes, MD Primary Care Physician:  Donnajean Lopes, MD  Indication for Procedure:  Colon cancer Surveillance   IMPRESSION:  Need for colon cancer surveillance Appropriate candidate for monitored anesthesia care  PLAN: Colonoscopy in [place] [facility] [place] today   HPI: Joyce Sharp is a 61 y.o. female presents for surveillance colonoscopy.  Prior endoscopic history: Colonoscopy with Dr. Ardis Hughs in 2011-06-19: Left sided diverticulosis, small internal hemorrhoids, small hyperplastic polyp   No known family history of colon cancer or polyps. No family history of uterine/endometrial cancer, pancreatic cancer or gastric/stomach cancer.   Past Medical History:  Diagnosis Date   Acute medial meniscus tear    right   Arthritis    Complication of anesthesia    slow to awaken after anesthesia   Diabetes mellitus without complication (HCC)    GERD (gastroesophageal reflux disease)    due to hiatal hernia   History of cervical cancer 2003-06-19   s/p  TAH 03-08-2004   History of hiatal hernia    History of kidney stones 06-18-2005   passed on own    HTN (hypertension) 04/05/2014   Hypercholesteremia    Hyperlipidemia    Hypertension    Hypothyroidism 04/05/2014   Obesity 04/05/2014    Past Surgical History:  Procedure Laterality Date   ABDOMINAL HYSTERECTOMY  03/08/2004   dr Ree Edman [place]  03-06-2004   dr Ree Edman   COLONOSCOPY     ENDOSCOPIC PLANTAR FASCIOTOMY Left 12/29/2008   KNEE ARTHROSCOPY WITH SUBCHONDROPLASTY Right 02/20/2017   Procedure: Right knee scope, partial medial menisectomy, medial tibial plateau subchondroplasty;  Surgeon: Nicholes Stairs, MD;  Location: [facility];  Service: Orthopedics;  Laterality: Right;  75 mins   LEFT HEART CATHETERIZATION WITH CORONARY ANGIOGRAM N/A 04/06/2014   Procedure: LEFT HEART CATHETERIZATION WITH CORONARY ANGIOGRAM;  Surgeon: Wellington Hampshire, MD;   Location: [facility];  Service: Cardiovascular;  Laterality: N/A;  normal coronaries; normal LVF, ef 60-65%    Current Outpatient Medications  Medication Sig Dispense Refill   acetaminophen (TYLENOL) 500 MG tablet Take 1,000 mg by mouth every 6 (six) hours as needed for mild pain or headache.     escitalopram (LEXAPRO) 10 MG tablet Take 10 mg by mouth daily.     levothyroxine (SYNTHROID, LEVOTHROID) 100 MCG tablet Take 100 mcg by mouth daily before breakfast.     losartan-hydrochlorothiazide (HYZAAR) 100-12.5 MG per tablet Take 1 tablet by mouth daily with breakfast.     pantoprazole (PROTONIX) 20 MG tablet Take 20 mg by mouth daily.     simvastatin (ZOCOR) 40 MG tablet Take 40 mg by mouth daily with breakfast.     meloxicam (MOBIC) 15 MG tablet Take 15 mg by mouth daily as needed for pain.      MOUNJARO 7.5 MG/0.5ML Pen SMARTSIG:7.5 Milligram(s) SUB-Q Once a Week     Current Facility-Administered Medications  Medication Dose Route Frequency Provider Last Rate Last Admin   0.9 %  sodium chloride infusion  500 mL Intravenous Continuous Thornton Park, MD        Allergies as of 01/25/2022 - Review Complete 01/25/2022  Allergen Reaction Noted   Invokana [canagliflozin] Rash and Other (See Comments) 04/05/2014    Family History  Problem Relation Age of Onset   Hypertension Mother    Hyperlipidemia Mother    Heart disease Father    CAD Brother    Colon cancer Neg  Hx    Colon polyps Neg Hx    Esophageal cancer Neg Hx    Rectal cancer Neg Hx    Stomach cancer Neg Hx      Physical Exam: General:   Alert,  well-nourished, pleasant and cooperative in NAD Head:  Normocephalic and atraumatic. Eyes:  Sclera clear, no icterus.   Conjunctiva pink. Mouth:  No deformity or lesions.   Neck:  Supple; no masses or thyromegaly. Lungs:  Clear throughout to auscultation.   No wheezes. Heart:  Regular rate and rhythm; no murmurs. Abdomen:  Soft, non-tender, nondistended, normal bowel  sounds, no rebound or guarding.  Msk:  Symmetrical. No boney deformities LAD: No inguinal or umbilical LAD Extremities:  No clubbing or edema. Neurologic:  Alert and  oriented x4;  grossly nonfocal Skin:  No obvious rash or bruise. Psych:  Alert and cooperative. Normal mood and affect.     Studies/Results: No results found.    Joyce Barrell L. Tarri Glenn, MD, MPH 01/25/2022, 11:12 AM

## 2022-01-25 NOTE — Patient Instructions (Signed)
Thank you for letting us take care of your healthcare needs today. Please see handouts given to you on Polyps Diverticulosis and High Fiber Diet. Drink atleast 64 oz of water daily, Add a stool bulking agent daily for example Metamucil.    YOU HAD AN ENDOSCOPIC PROCEDURE TODAY AT [facility]:   Refer to the procedure report that was given to you for any specific questions about what was found during the examination.  If the procedure report does not answer your questions, please call your gastroenterologist to clarify.  If you requested that your care partner not be given the details of your procedure findings, then the procedure report has been included in a sealed envelope for you to review at your convenience later.  YOU SHOULD EXPECT: Some feelings of bloating in the abdomen. Passage of more gas than usual.  Walking can help get rid of the air that was put into your GI tract during the procedure and reduce the bloating. If you had a lower endoscopy (such as a colonoscopy or flexible sigmoidoscopy) you may notice spotting of blood in your stool or on the toilet paper. If you underwent a bowel prep for your procedure, you may not have a normal bowel movement for a few days.  Please Note:  You might notice some irritation and congestion in your nose or some drainage.  This is from the oxygen used during your procedure.  There is no need for concern and it should clear up in a day or so.  SYMPTOMS TO REPORT IMMEDIATELY:  Following lower endoscopy (colonoscopy or flexible sigmoidoscopy):  Excessive amounts of blood in the stool  Significant tenderness or worsening of abdominal pains  Swelling of the abdomen that is new, acute  Fever of 100F or higher   For urgent or emergent issues, a gastroenterologist can be reached at any hour by calling [phone]. Do not use MyChart messaging for urgent concerns.    DIET:  We do recommend a small meal at first, but then you may  proceed to your regular diet.  Drink plenty of fluids but you should avoid alcoholic beverages for 24 hours.  ACTIVITY:  You should plan to take it easy for the rest of today and you should NOT DRIVE or use heavy machinery until tomorrow (because of the sedation medicines used during the test).    FOLLOW UP: Our staff will call the number listed on your records the next business day following your procedure.  We will call around 7:15- 8:00 am to check on you and address any questions or concerns that you may have regarding the information given to you following your procedure. If we do not reach you, we will leave a message.     If any biopsies were taken you will be contacted by phone or by letter within the next 1-3 weeks.  Please call us at [phone] if you have not heard about the biopsies in 3 weeks.    SIGNATURES/CONFIDENTIALITY: You and/or your care partner have signed paperwork which will be entered into your electronic medical record.  These signatures attest to the fact that that the information above on your After Visit Summary has been reviewed and is understood.  Full responsibility of the confidentiality of this discharge information lies with you and/or your care-partner.

## 2022-01-25 NOTE — Progress Notes (Signed)
Called to room to assist during endoscopic procedure.  Patient ID and intended procedure confirmed with present staff. Received instructions for my participation in the procedure from the performing physician.  

## 2022-01-25 NOTE — Op Note (Signed)
[place] Patient Name: Joyce Sharp Procedure Date: 01/25/2022 11:20 AM MRN: [id number] Endoscopist: Thornton Park MD, MD, [id number] Age: 61 Referring MD:  Date of Birth: 1960/10/21 Gender: Female Account #: [id number] Procedure:                Colonoscopy Indications:              Screening for colorectal malignant neoplasm                           Small hyperplastic polyp on colonoscopy with Dr.                            Ardis Hughs in 2013 Medicines:                Monitored Anesthesia Care Procedure:                Pre-Anesthesia Assessment:                           - Prior to the procedure, a History and Physical                            was performed, and patient medications and                            allergies were reviewed. The patient's tolerance of                            previous anesthesia was also reviewed. The risks                            and benefits of the procedure and the sedation                            options and risks were discussed with the patient.                            All questions were answered, and informed consent                            was obtained. Prior Anticoagulants: The patient has                            taken no anticoagulant or antiplatelet agents. ASA                            Grade Assessment: II - A patient with mild systemic                            disease. After reviewing the risks and benefits,                            the patient was deemed in satisfactory condition to  undergo the procedure.                           After obtaining informed consent, the colonoscope                            was passed under direct vision. Throughout the                            procedure, the patient's blood pressure, pulse, and                            oxygen saturations were monitored continuously. The                            Colonoscope was introduced through the  anus and                            advanced to the 4 cm into the ileum. A second                            forward view of the right colon was performed. The                            colonoscopy was performed without difficulty. The                            patient tolerated the procedure well. The quality                            of the bowel preparation was good. The terminal                            ileum, ileocecal valve, appendiceal orifice, and                            rectum were photographed. Scope In: 11:23:13 AM Scope Out: 11:38:41 AM Scope Withdrawal Time: 0 hours 12 minutes 14 seconds  Total Procedure Duration: 0 hours 15 minutes 28 seconds  Findings:                 The perianal and digital rectal examinations were                            normal.                           Multiple large-mouthed, medium-mouthed and                            small-mouthed diverticula were found in the sigmoid                            colon, descending colon and ascending colon.  A 3 mm polyp was found in the transverse colon. The                            polyp was sessile. The polyp was removed with a                            cold snare. Resection and retrieval were complete.                            Estimated blood loss was minimal.                           The exam was otherwise without abnormality on                            direct and retroflexion views. Complications:            No immediate complications. Estimated Blood Loss:     Estimated blood loss was minimal. Impression:               - Diverticulosis in the sigmoid colon, in the                            descending colon and in the ascending colon.                           - One 3 mm polyp in the transverse colon, removed                            with a cold snare. Resected and retrieved.                           - The examination was otherwise normal on direct                             and retroflexion views. Recommendation:           - Patient has a contact number available for                            emergencies. The signs and symptoms of potential                            delayed complications were discussed with the                            patient. Return to normal activities tomorrow.                            Written discharge instructions were provided to the                            patient.                           -  Continue present medications.                           - Await pathology results.                           - Repeat colonoscopy date to be determined after                            pending pathology results are reviewed for                            surveillance.                           - Follow a high fiber diet. Drink at least 64                            ounces of water daily. Add a daily stool bulking                            agent such as psyllium (an exampled would be                            Metamucil).                           - Emerging evidence supports eating a diet of                            fruits, vegetables, grains, calcium, and yogurt                            while reducing red meat and alcohol may reduce the                            risk of colon cancer.                           - Thank you for allowing me to be involved in your                            colon cancer prevention. Thornton Park MD, MD 01/25/2022 11:44:15 AM This report has been signed electronically.

## 2022-01-25 NOTE — Progress Notes (Signed)
To PACU, VSS. Report to [email or web address] 

## 2022-01-25 NOTE — Progress Notes (Signed)
Vital signs checked by:CW  The patient states no changes in medical or surgical history since pre-visit screening on 01/10/22   

## 2022-01-28 ENCOUNTER — Telehealth: Payer: Self-pay | Admitting: *Deleted

## 2022-01-28 NOTE — Telephone Encounter (Signed)
  Follow up Call-     01/25/2022   10:10 AM  Call back number  Post procedure Call Back phone  # [phone]  Permission to leave phone message Yes     Patient questions:  Do you have a fever, pain , or abdominal swelling? No. Pain Score  0 *  Have you tolerated food without any problems? Yes.    Have you been able to return to your normal activities? Yes.    Do you have any questions about your discharge instructions: Diet   No. Medications  No. Follow up visit  No.  Do you have questions or concerns about your Care? No.  Actions: * If pain score is 4 or above: No action needed, pain <4.

## 2022-01-29 ENCOUNTER — Encounter: Payer: Self-pay | Admitting: Gastroenterology

## 2022-08-02 ENCOUNTER — Other Ambulatory Visit (HOSPITAL_COMMUNITY): Payer: Self-pay | Admitting: Orthopedic Surgery

## 2022-10-10 ENCOUNTER — Encounter (HOSPITAL_BASED_OUTPATIENT_CLINIC_OR_DEPARTMENT_OTHER): Payer: Self-pay | Admitting: Orthopedic Surgery

## 2022-10-15 ENCOUNTER — Encounter (HOSPITAL_BASED_OUTPATIENT_CLINIC_OR_DEPARTMENT_OTHER)
Admission: RE | Admit: 2022-10-15 | Discharge: 2022-10-15 | Disposition: A | Discharge status: Home / Self Care | Payer: BC Managed Care – PPO | Source: Ambulatory Visit | Attending: Orthopedic Surgery | Admitting: Orthopedic Surgery

## 2022-10-15 DIAGNOSIS — Z01818 Encounter for other preprocedural examination: Secondary | ICD-10-CM | POA: Insufficient documentation

## 2022-10-15 DIAGNOSIS — E119 Type 2 diabetes mellitus without complications: Secondary | ICD-10-CM | POA: Insufficient documentation

## 2022-10-15 DIAGNOSIS — I10 Essential (primary) hypertension: Secondary | ICD-10-CM | POA: Insufficient documentation

## 2022-10-15 DIAGNOSIS — M7741 Metatarsalgia, right foot: Secondary | ICD-10-CM | POA: Diagnosis not present

## 2022-10-15 DIAGNOSIS — Z6841 Body Mass Index (BMI) 40.0 and over, adult: Secondary | ICD-10-CM | POA: Diagnosis not present

## 2022-10-15 DIAGNOSIS — M21611 Bunion of right foot: Secondary | ICD-10-CM | POA: Diagnosis present

## 2022-10-15 DIAGNOSIS — M2041 Other hammer toe(s) (acquired), right foot: Secondary | ICD-10-CM | POA: Diagnosis not present

## 2022-10-15 LAB — BASIC METABOLIC PANEL
Anion gap: 7 (ref 5–15)
BUN: 20 mg/dL (ref 8–23)
CO2: 28 mmol/L (ref 22–32)
Calcium: 9.3 mg/dL (ref 8.9–10.3)
Chloride: 104 mmol/L (ref 98–111)
Creatinine, Ser: 0.96 mg/dL (ref 0.44–1.00)
GFR, Estimated: >60 mL/min (ref >60)
Glucose, Bld: 99 mg/dL (ref 70–99)
Potassium: 4.2 mmol/L (ref 3.5–5.1)
Sodium: 139 mmol/L (ref 135–145)

## 2022-10-15 NOTE — Progress Notes (Signed)

## 2022-10-16 NOTE — H&P (Cosign Needed)
Joyce Sharp is an 62 y.o. female.   Chief Complaint: Right forefoot pain HPI: Patient is a 62 yo female with hx of right painful bunion and 2nd hammertoe.  She has failed conservative treatment including footwear modification.  She presents today for elective right MIS chevron and Akin osteotomy in conjunction with second metatarsal Weil osteotomy and hammertoe correction with possible collateral ligament repair.   Allergies:  Allergies  Allergen Reactions   Invokana [Canagliflozin] Rash and Other (See Comments)    Dizzy spells     Past Medical History:  Diagnosis Date   Acute medial meniscus tear    right   Arthritis    Complication of anesthesia    slow to awaken after anesthesia   Diabetes mellitus without complication (HCC)    GERD (gastroesophageal reflux disease)    due to hiatal hernia   History of cervical cancer 11/13/03   s/p  TAH 03-08-2004   History of hiatal hernia    History of kidney stones 11/12/2005   passed on own    HTN (hypertension) 04/05/2014   Hypercholesteremia    Hyperlipidemia    Hypertension    Hypothyroidism 04/05/2014   Obesity 04/05/2014    Past Surgical History:  Procedure Laterality Date   ABDOMINAL HYSTERECTOMY  03/08/2004   dr Elana Alm [facility]   cervcal ca   COLD KNIFE CERVICAL CONIZATION  03-06-2004   dr Elana Alm   COLONOSCOPY     ENDOSCOPIC PLANTAR FASCIOTOMY Left 12/29/2008   KNEE ARTHROSCOPY WITH SUBCHONDROPLASTY Right 02/20/2017   Procedure: Right knee scope, partial medial menisectomy, medial tibial plateau subchondroplasty;  Surgeon: Yolonda Kida, MD;  Location: [facility];  Service: Orthopedics;  Laterality: Right;  75 mins   LEFT HEART CATHETERIZATION WITH CORONARY ANGIOGRAM N/A 04/06/2014   Procedure: LEFT HEART CATHETERIZATION WITH CORONARY ANGIOGRAM;  Surgeon: Iran Ouch, MD;  Location: [facility];  Service: Cardiovascular;  Laterality: N/A;  normal coronaries; normal LVF, ef 60-65%    Family  History: Family History  Problem Relation Age of Onset   Hypertension Mother    Hyperlipidemia Mother    Heart disease Father    CAD Brother    Colon cancer Neg Hx    Colon polyps Neg Hx    Esophageal cancer Neg Hx    Rectal cancer Neg Hx    Stomach cancer Neg Hx     Social History:   reports that she has never smoked. She has never used smokeless tobacco. She reports that she does not drink alcohol and does not use drugs.  Medications: No medications prior to admission.    Results for orders placed or performed during the hospital encounter of 10/17/22 (from the past 48 hour(s))  Basic metabolic panel per protocol     Status: None   Collection Time: 10/15/22 12:01 PM  Result Value Ref Range   Sodium 139 135 - 145 mmol/L   Potassium 4.2 3.5 - 5.1 mmol/L   Chloride 104 98 - 111 mmol/L   CO2 28 22 - 32 mmol/L   Glucose, Bld 99 70 - 99 mg/dL    Comment: Glucose reference range applies only to samples taken after fasting for at least 8 hours.   BUN 20 8 - 23 mg/dL   Creatinine, Ser 3.66 0.44 - 1.00 mg/dL   Calcium 9.3 8.9 - 44.0 mg/dL   GFR, Estimated >34 >74 mL/min    Comment: (NOTE) Calculated using the CKD-EPI Creatinine Equation 2019-11-13)    Anion  gap 7 5 - 15    Comment: Performed at [facility], [address] [place], [place] [address]    No results found.    Height 5\' 3"  (1.6 m), weight 102.1 kg.  PE:  well nourished and well developed.  NAD.  EOMI.  Resp unlabored. Right bunion deformity and 2nd hammertoe.  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  The patient specifically understands risks of bleeding, infection, nerve damage, blood clots, need for additional surgery, continued pain, nonunion, post traumatic arthritis, recurrence of deformity,  amputation and death.   Alfredo Martinez PA-C [facility] Office:  [phone]   No changes since note above.

## 2022-10-17 ENCOUNTER — Other Ambulatory Visit: Payer: Self-pay

## 2022-10-17 ENCOUNTER — Ambulatory Visit (HOSPITAL_BASED_OUTPATIENT_CLINIC_OR_DEPARTMENT_OTHER): Payer: BC Managed Care – PPO

## 2022-10-17 ENCOUNTER — Ambulatory Visit (HOSPITAL_BASED_OUTPATIENT_CLINIC_OR_DEPARTMENT_OTHER): Payer: BC Managed Care – PPO | Admitting: Certified Registered"

## 2022-10-17 ENCOUNTER — Encounter (HOSPITAL_BASED_OUTPATIENT_CLINIC_OR_DEPARTMENT_OTHER)
Admission: RE | Disposition: A | Discharge status: Home / Self Care | Payer: Self-pay | Source: Home / Self Care | Attending: Orthopedic Surgery

## 2022-10-17 ENCOUNTER — Ambulatory Visit (HOSPITAL_BASED_OUTPATIENT_CLINIC_OR_DEPARTMENT_OTHER)
Admission: RE | Admit: 2022-10-17 | Discharge: 2022-10-17 | Disposition: A | Discharge status: Home / Self Care | Payer: BC Managed Care – PPO | Attending: Orthopedic Surgery | Admitting: Orthopedic Surgery

## 2022-10-17 ENCOUNTER — Encounter (HOSPITAL_BASED_OUTPATIENT_CLINIC_OR_DEPARTMENT_OTHER): Payer: Self-pay | Admitting: Orthopedic Surgery

## 2022-10-17 DIAGNOSIS — E119 Type 2 diabetes mellitus without complications: Secondary | ICD-10-CM | POA: Insufficient documentation

## 2022-10-17 DIAGNOSIS — I10 Essential (primary) hypertension: Secondary | ICD-10-CM | POA: Insufficient documentation

## 2022-10-17 DIAGNOSIS — M21611 Bunion of right foot: Secondary | ICD-10-CM | POA: Insufficient documentation

## 2022-10-17 DIAGNOSIS — M2041 Other hammer toe(s) (acquired), right foot: Secondary | ICD-10-CM | POA: Insufficient documentation

## 2022-10-17 DIAGNOSIS — Z6841 Body Mass Index (BMI) 40.0 and over, adult: Secondary | ICD-10-CM | POA: Insufficient documentation

## 2022-10-17 DIAGNOSIS — M7741 Metatarsalgia, right foot: Secondary | ICD-10-CM | POA: Insufficient documentation

## 2022-10-17 HISTORY — PX: BUNIONECTOMY: SHX129

## 2022-10-17 HISTORY — PX: HAMMERTOE RECONSTRUCTION WITH WEIL OSTEOTOMY: SHX5631

## 2022-10-17 LAB — GLUCOSE, CAPILLARY
Glucose-Capillary: 93 mg/dL (ref 70–99)
Glucose-Capillary: 106 mg/dL — ABNORMAL HIGH (ref 70–99)

## 2022-10-17 SURGERY — HAMMERTOE RECONSTRUCTION WITH WEIL OSTEOTOMY
Anesthesia: General | Site: Toe | Laterality: Right

## 2022-10-17 MED ORDER — CEFAZOLIN SODIUM-DEXTROSE 2-4 GM/100ML-% IV SOLN
INTRAVENOUS | Status: AC
  Filled 2022-10-17: qty 100

## 2022-10-17 MED ORDER — AMISULPRIDE (ANTIEMETIC) 5 MG/2ML IV SOLN: 10.0000 mg | Freq: Once | INTRAVENOUS | Status: DC | PRN

## 2022-10-17 MED ORDER — OXYCODONE HCL 5 MG PO TABS: 5.0000 mg | ORAL_TABLET | Freq: Once | ORAL | Status: DC | PRN

## 2022-10-17 MED ORDER — MIDAZOLAM HCL 2 MG/2ML IJ SOLN
2.0000 mg | Freq: Once | INTRAMUSCULAR | Status: AC
  Administered 2022-10-17: 2 mg via INTRAVENOUS

## 2022-10-17 MED ORDER — OXYCODONE HCL 5 MG/5ML PO SOLN: 5.0000 mg | Freq: Once | ORAL | Status: DC | PRN

## 2022-10-17 MED ORDER — FENTANYL CITRATE (PF) 100 MCG/2ML IJ SOLN
100.0000 ug | Freq: Once | INTRAMUSCULAR | Status: AC
  Administered 2022-10-17: 100 ug via INTRAVENOUS

## 2022-10-17 MED ORDER — FENTANYL CITRATE (PF) 100 MCG/2ML IJ SOLN
INTRAMUSCULAR | Status: AC
  Filled 2022-10-17: qty 2

## 2022-10-17 MED ORDER — PROPOFOL 10 MG/ML IV BOLUS
INTRAVENOUS | Status: DC | PRN
  Administered 2022-10-17: 200 mg via INTRAVENOUS

## 2022-10-17 MED ORDER — PROPOFOL 500 MG/50ML IV EMUL
INTRAVENOUS | Status: DC | PRN
  Administered 2022-10-17: 25 ug/kg/min via INTRAVENOUS

## 2022-10-17 MED ORDER — CEFAZOLIN SODIUM-DEXTROSE 2-4 GM/100ML-% IV SOLN
2.0000 g | INTRAVENOUS | Status: AC
  Administered 2022-10-17: 15 g via INTRAVENOUS

## 2022-10-17 MED ORDER — ONDANSETRON HCL 4 MG/2ML IJ SOLN
INTRAMUSCULAR | Status: DC | PRN
  Administered 2022-10-17: 4 mg via INTRAVENOUS

## 2022-10-17 MED ORDER — PROMETHAZINE HCL 25 MG/ML IJ SOLN: 6.2500 mg | INTRAMUSCULAR | Status: DC | PRN

## 2022-10-17 MED ORDER — LACTATED RINGERS IV SOLN: INTRAVENOUS | Status: DC

## 2022-10-17 MED ORDER — OXYCODONE HCL 5 MG PO TABS: 5.0000 mg | ORAL_TABLET | Freq: Four times a day (QID) | ORAL | 0 refills | Status: AC | PRN

## 2022-10-17 MED ORDER — HYDROMORPHONE HCL 1 MG/ML IJ SOLN: 0.2500 mg | INTRAMUSCULAR | Status: DC | PRN

## 2022-10-17 MED ORDER — DOCUSATE SODIUM 100 MG PO CAPS
100.0000 mg | ORAL_CAPSULE | Freq: Two times a day (BID) | ORAL | 0 refills | Status: AC
Start: 2022-10-17 — End: ?

## 2022-10-17 MED ORDER — MEPERIDINE HCL 25 MG/ML IJ SOLN: 6.2500 mg | INTRAMUSCULAR | Status: DC | PRN

## 2022-10-17 MED ORDER — MIDAZOLAM HCL 2 MG/2ML IJ SOLN
INTRAMUSCULAR | Status: AC
  Filled 2022-10-17: qty 2

## 2022-10-17 MED ORDER — ROPIVACAINE HCL 5 MG/ML IJ SOLN
INTRAMUSCULAR | Status: DC | PRN
Start: 2022-10-17 — End: 2022-10-17
  Administered 2022-10-17: 30 mL via PERINEURAL

## 2022-10-17 MED ORDER — EPHEDRINE SULFATE (PRESSORS) 50 MG/ML IJ SOLN
INTRAMUSCULAR | Status: DC | PRN
  Administered 2022-10-17: 10 mg via INTRAVENOUS
  Administered 2022-10-17 (×2): 5 mg via INTRAVENOUS

## 2022-10-17 MED ORDER — SENNA 8.6 MG PO TABS: 2.0000 | ORAL_TABLET | Freq: Two times a day (BID) | ORAL | 0 refills | Status: AC

## 2022-10-17 MED ORDER — DEXAMETHASONE SODIUM PHOSPHATE 10 MG/ML IJ SOLN
INTRAMUSCULAR | Status: DC | PRN
  Administered 2022-10-17: 3 mg via INTRAVENOUS

## 2022-10-17 MED ORDER — SODIUM CHLORIDE 0.9 % IV SOLN: INTRAVENOUS | Status: DC

## 2022-10-17 MED ORDER — FENTANYL CITRATE (PF) 100 MCG/2ML IJ SOLN
INTRAMUSCULAR | Status: DC | PRN
  Administered 2022-10-17: 30 ug via INTRAVENOUS

## 2022-10-17 SURGICAL SUPPLY — 99 items
APL PRP STRL LF DISP 70% ISPRP (MISCELLANEOUS) ×2
BANDAGE ESMARK 6X9 LF (GAUZE/BANDAGES/DRESSINGS) IMPLANT
BIT DRILL 1.8 CANN MAX VPC (BIT) IMPLANT
BIT DRILL 3 CANN STRGHT (BIT) ×2
BIT DRILL CANN 2.9 (BIT) IMPLANT
BLADE AVERAGE 25X9 (BLADE) IMPLANT
BLADE LONG MED 25X9 (BLADE) IMPLANT
BLADE MICRO SAGITTAL (BLADE) IMPLANT
BLADE MINI RND TIP GREEN BEAV (BLADE) ×2 IMPLANT
BLADE OSC/SAG .038X5.5 CUT EDG (BLADE) IMPLANT
BLADE SURG 15 STRL LF DISP TIS (BLADE) ×4 IMPLANT
BLADE SURG 15 STRL SS (BLADE) ×4
BNDG ADH 5X4 AIR PERM ELC (GAUZE/BANDAGES/DRESSINGS)
BNDG CMPR 5X4 KNIT ELC UNQ LF (GAUZE/BANDAGES/DRESSINGS) ×2
BNDG CMPR 6 X 5 YARDS HK CLSR (GAUZE/BANDAGES/DRESSINGS)
BNDG CMPR 75X21 PLY HI ABS (MISCELLANEOUS)
BNDG CMPR 9X4 STRL LF SNTH (GAUZE/BANDAGES/DRESSINGS)
BNDG CMPR 9X6 STRL LF SNTH (GAUZE/BANDAGES/DRESSINGS)
BNDG COHESIVE 4X5 WHT NS (GAUZE/BANDAGES/DRESSINGS) IMPLANT
BNDG ELASTIC 4INX 5YD STR LF (GAUZE/BANDAGES/DRESSINGS) ×2 IMPLANT
BNDG ELASTIC 6INX 5YD STR LF (GAUZE/BANDAGES/DRESSINGS) IMPLANT
BNDG ESMARK 4X9 LF (GAUZE/BANDAGES/DRESSINGS) IMPLANT
BNDG ESMARK 6X9 LF (GAUZE/BANDAGES/DRESSINGS)
BNDG GZE 12X3 1 PLY HI ABS (GAUZE/BANDAGES/DRESSINGS) ×2
BNDG STRETCH GAUZE 3IN X12FT (GAUZE/BANDAGES/DRESSINGS) ×2 IMPLANT
BUR MIS CONICAL WEDGE 4.3X13 (BURR) IMPLANT
BUR MIS STRT 2.0X19.5 (BURR) IMPLANT
BURR MIS CONICAL WEDGE 4.3X13 (BURR)
BURR MIS STRT 2.0X19.5 (BURR) ×2
CAP PIN PROTECTOR ORTHO WHT (CAP) IMPLANT
CHLORAPREP W/TINT 26 (MISCELLANEOUS) ×2 IMPLANT
COVER BACK TABLE 60X90IN (DRAPES) ×2 IMPLANT
CUFF TOURN SGL QUICK 24 (TOURNIQUET CUFF)
CUFF TOURN SGL QUICK 34 (TOURNIQUET CUFF)
CUFF TRNQT CYL 24X4X16.5-23 (TOURNIQUET CUFF) IMPLANT
CUFF TRNQT CYL 34X4.125X (TOURNIQUET CUFF) IMPLANT
DRAPE EXTREMITY T 121X128X90 (DISPOSABLE) ×2 IMPLANT
DRAPE OEC MINIVIEW 54X84 (DRAPES) ×2 IMPLANT
DRAPE U-SHAPE 47X51 STRL (DRAPES) ×2 IMPLANT
DRESSING MEPILEX FLEX 4X4 (GAUZE/BANDAGES/DRESSINGS) IMPLANT
DRILL COUNTERSINK CANN 3 (BIT) IMPLANT
DRSG MEPILEX FLEX 4X4 (GAUZE/BANDAGES/DRESSINGS)
DRSG MEPITEL 4X7.2 (GAUZE/BANDAGES/DRESSINGS) ×2 IMPLANT
ELECT REM PT RETURN 9FT ADLT (ELECTROSURGICAL) ×2
ELECTRODE REM PT RTRN 9FT ADLT (ELECTROSURGICAL) ×2 IMPLANT
GAUZE PAD ABD 8X10 STRL (GAUZE/BANDAGES/DRESSINGS) ×2 IMPLANT
GAUZE SPONGE 4X4 12PLY STRL (GAUZE/BANDAGES/DRESSINGS) ×2 IMPLANT
GAUZE STRETCH 2X75IN STRL (MISCELLANEOUS) IMPLANT
GLOVE BIO SURGEON STRL SZ8 (GLOVE) ×2 IMPLANT
GLOVE BIOGEL PI IND STRL 8 (GLOVE) ×4 IMPLANT
GLOVE ECLIPSE 8.0 STRL XLNG CF (GLOVE) ×2 IMPLANT
GOWN STRL REUS W/ TWL LRG LVL3 (GOWN DISPOSABLE) ×2 IMPLANT
GOWN STRL REUS W/ TWL XL LVL3 (GOWN DISPOSABLE) ×4 IMPLANT
GOWN STRL REUS W/TWL LRG LVL3 (GOWN DISPOSABLE) ×2
GOWN STRL REUS W/TWL XL LVL3 (GOWN DISPOSABLE) ×4
GUIDEWIRE 0.86MM (WIRE) IMPLANT
GUIDEWIRE BEVELED FT 1.4X3.5 (WIRE) IMPLANT
K-WIRE COCR 0.9X95 (WIRE) ×2
K-WIRE DBL .054X4 NSTRL (WIRE)
K-WIRE DBL .054X9 NSTRL (WIRE) ×2
KWIRE COCR 0.9X95 (WIRE) IMPLANT
KWIRE DBL .054X4 NSTRL (WIRE) IMPLANT
KWIRE DBL .054X9 NSTRL (WIRE) ×2 IMPLANT
NDL HYPO 22X1.5 SAFETY MO (MISCELLANEOUS) IMPLANT
NDL HYPO 25X1 1.5 SAFETY (NEEDLE) IMPLANT
NEEDLE HYPO 22X1.5 SAFETY MO (MISCELLANEOUS)
NEEDLE HYPO 25X1 1.5 SAFETY (NEEDLE)
NS IRRIG 1000ML POUR BTL (IV SOLUTION) ×2 IMPLANT
PACK BASIN DAY SURGERY FS (CUSTOM PROCEDURE TRAY) ×2 IMPLANT
PAD CAST 4YDX4 CTTN HI CHSV (CAST SUPPLIES) ×2 IMPLANT
PADDING CAST ABS COTTON 4X4 ST (CAST SUPPLIES) IMPLANT
PADDING CAST COTTON 4X4 STRL (CAST SUPPLIES) ×2
PADDING CAST COTTON 6X4 STRL (CAST SUPPLIES) IMPLANT
PASSER SUT SWANSON 36MM LOOP (INSTRUMENTS) IMPLANT
PENCIL SMOKE EVACUATOR (MISCELLANEOUS) ×2 IMPLANT
SANITIZER HAND PURELL FF 515ML (MISCELLANEOUS) ×2 IMPLANT
SCREW BEVELED 3.5X34 (Screw) IMPLANT
SCREW CANN COMP FT 2.5X22 (Screw) IMPLANT
SCREW CANN FT BEVELED 3.5X46 (Screw) IMPLANT
SCREW MAX VPC 2.5X20 (Screw) IMPLANT
SET IRRIGATION TUBING (TUBING) IMPLANT
SHEET MEDIUM DRAPE 40X70 STRL (DRAPES) ×2 IMPLANT
SLEEVE SCD COMPRESS KNEE MED (STOCKING) ×2 IMPLANT
SPLINT PLASTER CAST FAST 5X30 (CAST SUPPLIES) IMPLANT
SPONGE T-LAP 18X18 ~~LOC~~+RFID (SPONGE) ×2 IMPLANT
STOCKINETTE 6 STRL (DRAPES) ×2 IMPLANT
SUCTION TUBE FRAZIER 10FR DISP (SUCTIONS) ×2 IMPLANT
SUT ETHILON 3 0 PS 1 (SUTURE) ×2 IMPLANT
SUT MNCRL AB 3-0 PS2 18 (SUTURE) ×2 IMPLANT
SUT VIC AB 2-0 SH 27 (SUTURE)
SUT VIC AB 2-0 SH 27XBRD (SUTURE) IMPLANT
SUT VICRYL 0 SH 27 (SUTURE) IMPLANT
SUT VICRYL 0 UR6 27IN ABS (SUTURE) IMPLANT
SYR BULB EAR ULCER 3OZ GRN STR (SYRINGE) ×2 IMPLANT
SYR CONTROL 10ML LL (SYRINGE) IMPLANT
TOWEL GREEN STERILE FF (TOWEL DISPOSABLE) ×4 IMPLANT
TUBE CONNECTING 20X1/4 (TUBING) ×2 IMPLANT
UNDERPAD 30X36 HEAVY ABSORB (UNDERPADS AND DIAPERS) ×2 IMPLANT
YANKAUER SUCT BULB TIP NO VENT (SUCTIONS) IMPLANT

## 2022-10-17 NOTE — Discharge Instructions (Addendum)
Post Anesthesia Home Care Instructions  Activity: Get plenty of rest for the remainder of the day. A responsible individual must stay with you for 24 hours following the procedure.  For the next 24 hours, DO NOT: -Drive a car -Paediatric nurse -Drink alcoholic beverages -Take any medication unless instructed by your physician -Make any legal decisions or sign important papers.  Meals: Start with liquid foods such as gelatin or soup. Progress to regular foods as tolerated. Avoid greasy, spicy, heavy foods. If nausea and/or vomiting occur, drink only clear liquids until the nausea and/or vomiting subsides. Call your physician if vomiting continues.  Special Instructions/Symptoms: Your throat may feel dry or sore from the anesthesia or the breathing tube placed in your throat during surgery. If this causes discomfort, gargle with warm salt water. The discomfort should disappear within 24 hours.  If you had a scopolamine patch placed behind your ear for the management of post- operative nausea and/or vomiting:  1. The medication in the patch is effective for 72 hours, after which it should be removed.  Wrap patch in a tissue and discard in the trash. Wash hands thoroughly with soap and water. 2. You may remove the patch earlier than 72 hours if you experience unpleasant side effects which may include dry mouth, dizziness or visual disturbances. 3. Avoid touching the patch. Wash your hands with soap and water after contact with the patch.    Regional Anesthesia Blocks  1. Numbness or the inability to move the "blocked" extremity may last from 3-48 hours after placement. The length of time depends on the medication injected and your individual response to the medication. If the numbness is not going away after 48 hours, call your surgeon.  2. The extremity that is blocked will need to be protected until the numbness is gone and the  Strength has returned. Because you cannot feel it, you will  need to take extra care to avoid injury. Because it may be weak, you may have difficulty moving it or using it. You may not know what position it is in without looking at it while the block is in effect.  3. For blocks in the legs and feet, returning to weight bearing and walking needs to be done carefully. You will need to wait until the numbness is entirely gone and the strength has returned. You should be able to move your leg and foot normally before you try and bear weight or walk. You will need someone to be with you when you first try to ensure you do not fall and possibly risk injury.  4. Bruising and tenderness at the needle site are common side effects and will resolve in a few days.  5. Persistent numbness or new problems with movement should be communicated to the surgeon or the [facility] [phone] [place] [phone]).   Wylene Simmer, MD [facility]  Please read the following information regarding your care after surgery.  Medications  You only need a prescription for the narcotic pain medicine (ex. oxycodone, Percocet, Norco).  All of the other medicines listed below are available over the counter. ? Aleve 2 pills twice a day for the first 3 days after surgery. ? acetominophen (Tylenol) 650 mg every 4-6 hours as you need for minor to moderate pain ? oxycodone as prescribed for severe pain  Narcotic pain medicine (ex. oxycodone, Percocet, Vicodin) will cause constipation.  To prevent this problem, take the following medicines while you are taking any  pain medicine. ? docusate sodium (Colace) 100 mg twice a day ? senna (Senokot) 2 tablets twice a day  Weight Bearing ? Bear weight only on your operated foot in the post-op shoe.   Cast / Splint / Dressing ? Keep your splint, cast or dressing clean and dry.  Don't put anything (coat hanger, pencil, etc) down inside of it.  If it gets damp, use a hair dryer on the cool setting to dry it.  If it  gets soaked, call the office to schedule an appointment for a cast change.   After your dressing, cast or splint is removed; you may shower, but do not soak or scrub the wound.  Allow the water to run over it, and then gently pat it dry.  Swelling It is normal for you to have swelling where you had surgery.  To reduce swelling and pain, keep your toes above your nose for at least 3 days after surgery.  It may be necessary to keep your foot or leg elevated for several weeks.  If it hurts, it should be elevated.  Follow Up Call my office at [phone] when you are discharged from the hospital or surgery center to schedule an appointment to be seen two weeks after surgery.  Call my office at [phone] if you develop a fever >101.5 F, nausea, vomiting, bleeding from the surgical site or severe pain.

## 2022-10-17 NOTE — Op Note (Signed)
10/17/2022  9:10 AM  PATIENT:  Joyce Sharp  62 y.o. female  PRE-OPERATIVE DIAGNOSIS: 1.  Painful right foot bunion deformity 2.  Right forefoot metatarsalgia 3.  Right second hammertoe deformity  POST-OPERATIVE DIAGNOSIS: Same  Procedure(s): 1.  Correction of right foot bunion deformity with double osteotomy 2.  Right second metatarsal Weil osteotomy 3.  Right foot extensor tendon lengthening (EDL to the second toe) 4.  Right second hammertoe correction 5.  AP, lateral and oblique radiographs of the right foot  SURGEON:  Toni Arthurs, MD  ASSISTANT: Alfredo Martinez, PA-C  ANESTHESIA:   General, regional  EBL:  minimal   TOURNIQUET: None  COMPLICATIONS:  None apparent  DISPOSITION:  Extubated, awake and stable to recovery.  INDICATION FOR PROCEDURE: 62 year old female without significant past medical history complains of worsening right foot pain due to a prominent bunion deformity as well metatarsalgia second hammertoe.  She has failed nonoperative treatment including activity modification, oral inflammatories and shoewear modification.  She presents today for surgical correction of this painful for deformities.  After pre operative consent was obtained, and the correct operative site was identified, the patient was brought to the operating room and placed supine on the OR table.  Anesthesia was administered.  Pre-operative antibiotics were administered.  A surgical timeout was taken.   PROCEDURE IN DETAIL:  After pre operative consent was obtained, and the correct operative site was identified, the patient was brought to the operating room and placed supine on the OR table.  Anesthesia was administered.  Pre-operative antibiotics were administered.  A surgical timeout was taken.  The right lower extremity was prepped and draped in standard sterile fashion.  A guidepin was then inserted at the medial base of the first metatarsal and fluoroscopic guidance.  It was advanced across  the lateral.  Radiographs confirmed appropriate position of the guidewire.  A parallel pin guide was then used to insert a second pin distal medial first.  Radiographs again confirmed appropriate pin.  The pin was removed pulled back slightly.  Under fluoroscopic guidance osteotomy was made at the first metatarsal neck using a percutaneous bur.  The head of the metatarsal was then translated laterally and the pin was advanced into the head.  Radiographs confirmed appropriate correction of the intermetatarsal angle.  Guidepins were then overdrilled.  Fully threaded 3.5 mm Arthrex screws were inserted and seated appropriately at the medial cortex of the first metatarsal.  A percutaneous Aiken osteotomy was then performed at the base of the hallux proximal phalanx.  The osteotomy was closed and fixed with a 2.5 mm fully threaded headless compression screw also from Arthrex.  Radiographs confirmed appropriate position of the osteotomy and screw.  The bur was then used to shave down the bony prominence at the first metatarsal osteotomy.  The wounds were irrigated and closed with nylon.  Attention was turned to the second ray.  The extensor digitorum longus tendon was released allowing passive correction of the toe at the level of the MTP joint.  An incision was then made medial to the metatarsal.  The periosteum was elevated dorsally and plantarly at the neck of the first metatarsal.  The bur was used to make an oblique osteotomy in the metatarsal neck under fluoroscopic guidance.  This allowed appropriate shortening of the second ray and lateral translation of the head.  Attention was turned to the second toe PIP joint.  A small incision was made medial to the joint.  The wire was then  inserted and used to resect the articular cartilage and subchondral bone from both sides of the joint.  A guidepin was then inserted from the tip of the toe across both IP joints.  Fluoroscopy views showed appropriate position of  the guidepin.  It was overdrilled.  A [employer] 2.5 mm VPC screw was then inserted over the guidepin compressing the PIP joint appropriately.  Guidepin was removed.  Final AP, oblique and lateral radiographs confirmed appropriate correction of the bunion and second hammertoe deformities.  Hardware was appropriately positioned and of the appropriate lengths.  No other acute injuries are noted.  The wounds were irrigated copiously and closed with nylon.  Sterile dressings were applied followed by a toe spacer and a bunion wrap.  Patient was awakened from anesthesia and transported to the recovery room in stable condition.   FOLLOW UP PLAN: Weightbearing as tolerated on the heel in a Darco shoe.  Follow-up in the office in 2 weeks for suture removal.  The ulcer will be replaced with a toe spacer in the second toe taped laterally.  Plan to change the taping every 2 weeks until 6 weeks postop.  No indication for DVT prophylaxis in this ambulatory patient.   RADIOGRAPHS: AP, oblique and lateral radiographs of the right foot are obtained intraoperatively.  These show clinical correction of the bunion deformity with a double osteotomy.  Hardware is appropriately positioned and of the appropriate lengths.  Also noted is interval shortening of the second metatarsal and correction of the hammertoe deformity with PIP arthrodesis.    Alfredo Martinez PA-C was present and scrubbed for the duration of the operative case. His assistance was essential in positioning the patient, prepping and draping, gaining and maintaining exposure, performing the operation, closing and dressing the wounds and applying the splint.

## 2022-10-17 NOTE — Transfer of Care (Signed)
Immediate Anesthesia Transfer of Care Note  Patient: Joyce Sharp  Procedure(s) Performed: Second Hammertoe  Reconstruction with Weil Osteotomy, possible collateral ligament repair (Right) Right minimally invasive chevron and akin osteotomies (Right: Toe)  Patient Location: PACU  Anesthesia Type:GA combined with regional for post-op pain  Level of Consciousness: awake, alert , oriented, and patient cooperative  Airway & Oxygen Therapy: Patient Spontanous Breathing and Patient connected to face mask oxygen  Post-op Assessment: Report given to RN and Post -op Vital signs reviewed and stable  Post vital signs: Reviewed and stable  Last Vitals:  Vitals Value Taken Time  BP 142/74 10/17/22 0903  Temp    Pulse 68 10/17/22 0904  Resp 14 10/17/22 0904  SpO2 99 % 10/17/22 0904  Vitals shown include unfiled device data.  Last Pain:  Vitals:   10/17/22 0641  TempSrc: Oral  PainSc: 0-No pain      Patients Stated Pain Goal: 5 (10/17/22 0641)  Complications: No notable events documented.

## 2022-10-17 NOTE — Anesthesia Preprocedure Evaluation (Signed)
Anesthesia Evaluation  Patient identified by MRN, date of birth, ID band Patient awake    Reviewed: Allergy & Precautions, H&P , NPO status , Patient's Chart, lab work & pertinent test results  Airway Mallampati: II  TM Distance: >3 FB Neck ROM: Full    Dental no notable dental hx.    Pulmonary neg pulmonary ROS   Pulmonary exam normal breath sounds clear to auscultation       Cardiovascular hypertension, negative cardio ROS Normal cardiovascular exam Rhythm:Regular Rate:Normal     Neuro/Psych negative neurological ROS  negative psych ROS   GI/Hepatic Neg liver ROS,GERD  ,,  Endo/Other  diabetesHypothyroidism  Morbid obesity  Renal/GU negative Renal ROS  negative genitourinary   Musculoskeletal  (+) Arthritis ,    Abdominal   Peds negative pediatric ROS (+)  Hematology negative hematology ROS (+)   Anesthesia Other Findings   Reproductive/Obstetrics negative OB ROS                             Anesthesia Physical Anesthesia Plan  ASA: 3  Anesthesia Plan: General   Post-op Pain Management: Regional block* and Minimal or no pain anticipated   Induction: Intravenous  PONV Risk Score and Plan: 3 and Ondansetron, Dexamethasone, Midazolam and Treatment may vary due to age or medical condition  Airway Management Planned: LMA  Additional Equipment:   Intra-op Plan:   Post-operative Plan: Extubation in OR  Informed Consent: I have reviewed the patients History and Physical, chart, labs and discussed the procedure including the risks, benefits and alternatives for the proposed anesthesia with the patient or authorized representative who has indicated his/her understanding and acceptance.     Dental advisory given  Plan Discussed with: CRNA  Anesthesia Plan Comments:        Anesthesia Quick Evaluation

## 2022-10-17 NOTE — Anesthesia Procedure Notes (Signed)
Anesthesia Regional Block: Popliteal block   Pre-Anesthetic Checklist: , timeout performed,  Correct Patient, Correct Site, Correct Laterality,  Correct Procedure, Correct Position, site marked,  Risks and benefits discussed,  Surgical consent,  Pre-op evaluation,  At surgeon's request and post-op pain management  Laterality: Right  Prep: chloraprep       Needles:  Injection technique: Single-shot  Needle Type: Stimiplex     Needle Length: 9cm  Needle Gauge: 21     Additional Needles:   Procedures:,,,, ultrasound used (permanent image in chart),,    Narrative:  Start time: 10/17/2022 7:31 AM End time: 10/17/2022 7:36 AM Injection made incrementally with aspirations every 5 mL.  Performed by: Personally  Anesthesiologist: Lowella Curb, MD

## 2022-10-17 NOTE — Anesthesia Postprocedure Evaluation (Signed)
Anesthesia Post Note  Patient: Joyce Sharp  Procedure(s) Performed: Second Hammertoe  Reconstruction with Weil Osteotomy (Right: Foot) Right minimally invasive chevron and akin osteotomies (Right: Toe)     Patient location during evaluation: PACU Anesthesia Type: General Level of consciousness: awake and alert Pain management: pain level controlled Vital Signs Assessment: post-procedure vital signs reviewed and stable Respiratory status: spontaneous breathing, nonlabored ventilation and respiratory function stable Cardiovascular status: blood pressure returned to baseline and stable Postop Assessment: no apparent nausea or vomiting Anesthetic complications: no   No notable events documented.  Last Vitals:  Vitals:   10/17/22 0930 10/17/22 1000  BP: (!) 144/78 (!) 144/73  Pulse: 60 67  Resp: 11 18  Temp:  (!) 36.2 C  SpO2: 97% 99%    Last Pain:  Vitals:   10/17/22 1000  TempSrc: Oral  PainSc: 0-No pain                 Lowella Curb

## 2022-10-17 NOTE — Progress Notes (Signed)
Assisted Dr. Miller with right, popliteal, ultrasound guided block. Side rails up, monitors on throughout procedure. See vital signs in flow sheet. Tolerated Procedure well. ?

## 2022-10-17 NOTE — Anesthesia Procedure Notes (Signed)
Procedure Name: LMA Insertion Date/Time: 10/17/2022 7:43 AM  Performed by: Danial Sisley, Jewel Baize, CRNAPre-anesthesia Checklist: Patient identified, Emergency Drugs available, Suction available and Patient being monitored Patient Re-evaluated:Patient Re-evaluated prior to induction Oxygen Delivery Method: Circle system utilized Preoxygenation: Pre-oxygenation with 100% oxygen Induction Type: IV induction Ventilation: Mask ventilation without difficulty LMA: LMA inserted LMA Size: 4.0 Number of attempts: 1 Airway Equipment and Method: Bite block Placement Confirmation: positive ETCO2 Tube secured with: Tape Dental Injury: Teeth and Oropharynx as per pre-operative assessment

## 2022-10-18 ENCOUNTER — Encounter (HOSPITAL_BASED_OUTPATIENT_CLINIC_OR_DEPARTMENT_OTHER): Payer: Self-pay | Admitting: Orthopedic Surgery

## 2023-01-10 ENCOUNTER — Other Ambulatory Visit: Payer: Self-pay | Admitting: Internal Medicine

## 2023-01-10 DIAGNOSIS — Z1231 Encounter for screening mammogram for malignant neoplasm of breast: Secondary | ICD-10-CM

## 2023-01-17 ENCOUNTER — Ambulatory Visit: Payer: BC Managed Care – PPO

## 2023-02-21 ENCOUNTER — Ambulatory Visit: Payer: BC Managed Care – PPO

## 2023-03-21 ENCOUNTER — Ambulatory Visit: Payer: BC Managed Care – PPO

## 2023-03-28 ENCOUNTER — Ambulatory Visit: Payer: BC Managed Care – PPO

## 2023-04-24 ENCOUNTER — Ambulatory Visit: Payer: Self-pay

## 2023-04-25 ENCOUNTER — Ambulatory Visit: Payer: Self-pay

## 2023-05-13 ENCOUNTER — Ambulatory Visit
Admission: RE | Admit: 2023-05-13 | Discharge: 2023-05-13 | Disposition: A | Discharge status: Home / Self Care | Payer: 59 | Source: Ambulatory Visit | Attending: Internal Medicine | Admitting: Internal Medicine

## 2023-05-13 DIAGNOSIS — Z1231 Encounter for screening mammogram for malignant neoplasm of breast: Secondary | ICD-10-CM

## 2023-11-18 ENCOUNTER — Encounter: Payer: Self-pay | Admitting: Plastic Surgery

## 2023-11-18 ENCOUNTER — Ambulatory Visit: Admitting: Plastic Surgery

## 2023-11-18 DIAGNOSIS — M542 Cervicalgia: Secondary | ICD-10-CM | POA: Diagnosis not present

## 2023-11-18 DIAGNOSIS — G8929 Other chronic pain: Secondary | ICD-10-CM

## 2023-11-18 DIAGNOSIS — M793 Panniculitis, unspecified: Secondary | ICD-10-CM | POA: Insufficient documentation

## 2023-11-18 DIAGNOSIS — M549 Dorsalgia, unspecified: Secondary | ICD-10-CM | POA: Insufficient documentation

## 2023-11-18 DIAGNOSIS — M546 Pain in thoracic spine: Secondary | ICD-10-CM

## 2023-11-18 NOTE — Progress Notes (Signed)
 Patient ID: Joyce Sharp, female    DOB: January 15, 1961, 63 y.o.   MRN: [id number]   Chief Complaint  Patient presents with   Consult    The patient is is a 63 year old female here for evaluation of her abdomen.  She is 5 feet 3 inches tall and weighs 210 pounds.  She is working on weight reduction and has been very diligent about improving her weight.  She complains about neck and back pain and rashes in her skin folds.  Her past medical history is positive for reflux and diabetes.  She has a history of cervical cancer and a hiatal hernia.  Her past surgical history is positive for abdominal hysterectomy and knee surgery.  He is also interested in having surgery on her legs.  She says she has been diagnosed with lipidemia and that is something she is interested in working on in the future.    Review of Systems  Constitutional: Negative.   Eyes: Negative.   Respiratory: Negative.    Cardiovascular: Negative.   Gastrointestinal: Negative.   Endocrine: Negative.   Genitourinary: Negative.   Musculoskeletal:  Positive for back pain and neck pain.    Past Medical History:  Diagnosis Date   Acute medial meniscus tear    right   Arthritis    Complication of anesthesia    slow to awaken after anesthesia   Diabetes mellitus without complication (HCC)    GERD (gastroesophageal reflux disease)    due to hiatal hernia   History of cervical cancer 12-02-03   s/p  TAH 03-08-2004   History of hiatal hernia    History of kidney stones 01-Dec-2005   passed on own    HTN (hypertension) 04/05/2014   Hypercholesteremia    Hyperlipidemia    Hypertension    Hypothyroidism 04/05/2014   Obesity 04/05/2014    Past Surgical History:  Procedure Laterality Date   ABDOMINAL HYSTERECTOMY  03/08/2004   dr kiki [facility]   cervcal ca   BUNIONECTOMY Right 10/17/2022   Procedure: Right minimally invasive chevron and akin osteotomies;  Surgeon: Kit Rush, MD;  Location: [facility];   Service: Orthopedics;  Laterality: Right;   COLD KNIFE CERVICAL CONIZATION  03-06-2004   dr kiki   COLONOSCOPY     ENDOSCOPIC PLANTAR FASCIOTOMY Left 12/29/2008   HAMMERTOE RECONSTRUCTION WITH WEIL OSTEOTOMY Right 10/17/2022   Procedure: Second Hammertoe  Reconstruction with Agustina Osteotomy;  Surgeon: Kit Rush, MD;  Location: [facility];  Service: Orthopedics;  Laterality: Right;   KNEE ARTHROSCOPY WITH SUBCHONDROPLASTY Right 02/20/2017   Procedure: Right knee scope, partial medial menisectomy, medial tibial plateau subchondroplasty;  Surgeon: Sharl Selinda Dover, MD;  Location: [facility];  Service: Orthopedics;  Laterality: Right;  75 mins   LEFT HEART CATHETERIZATION WITH CORONARY ANGIOGRAM N/A 04/06/2014   Procedure: LEFT HEART CATHETERIZATION WITH CORONARY ANGIOGRAM;  Surgeon: Deatrice DELENA Cage, MD;  Location: [facility];  Service: Cardiovascular;  Laterality: N/A;  normal coronaries; normal LVF, ef 60-65%      Current Outpatient Medications:    acetaminophen  (TYLENOL ) 500 MG tablet, Take 1,000 mg by mouth every 6 (six) hours as needed for mild pain or headache., Disp: , Rfl:    docusate sodium  (COLACE) 100 MG capsule, Take 1 capsule (100 mg total) by mouth 2 (two) times daily. While taking narcotic pain medicine., Disp: 30 capsule, Rfl: 0   escitalopram (LEXAPRO) 10 MG tablet, Take 10 mg by mouth daily.,  Disp: , Rfl:    gabapentin (NEURONTIN) 300 MG capsule, Take 300 mg by mouth at bedtime as needed., Disp: , Rfl:    levothyroxine  (SYNTHROID , LEVOTHROID) 100 MCG tablet, Take 100 mcg by mouth daily before breakfast., Disp: , Rfl:    losartan -hydrochlorothiazide  (HYZAAR) 100-12.5 MG per tablet, Take 1 tablet by mouth daily with breakfast., Disp: , Rfl:    meloxicam (MOBIC) 15 MG tablet, Take 15 mg by mouth daily as needed for pain. , Disp: , Rfl:    MOUNJARO 15 MG/0.5ML Pen, SMARTSIG:15 Milligram(s) SUB-Q Once a Week, Disp: , Rfl:    pantoprazole   (PROTONIX ) 20 MG tablet, Take 20 mg by mouth daily., Disp: , Rfl:    senna (SENOKOT) 8.6 MG TABS tablet, Take 2 tablets (17.2 mg total) by mouth 2 (two) times daily., Disp: 30 tablet, Rfl: 0   simvastatin (ZOCOR) 40 MG tablet, Take 40 mg by mouth daily with breakfast., Disp: , Rfl:    Objective:   There were no vitals filed for this visit.  Physical Exam Vitals reviewed.  Constitutional:      Appearance: Normal appearance.  Cardiovascular:     Rate and Rhythm: Normal rate.     Pulses: Normal pulses.  Pulmonary:     Effort: Pulmonary effort is normal.  Abdominal:     Palpations: Abdomen is soft.  Skin:    General: Skin is warm.  Neurological:     Mental Status: She is alert and oriented to person, place, and time.  Psychiatric:        Mood and Affect: Mood normal.        Behavior: Behavior normal.        Thought Content: Thought content normal.        Judgment: Judgment normal.     Assessment & Plan:  Panniculitis  Chronic bilateral thoracic back pain  We discussed the difference between panniculectomy and abdominoplasty.  She would like to work towards losing another 20 pounds.  I think she will have a better result if she waits until she loses that weight before she has the surgery.  The patient is in agreement and seems to understand.  Will plan on a panniculectomy with possible add-on upper abdomen with liposuction.  She will contact us  in the next couple of months and we will discuss timing.  Pictures were obtained of the patient and placed in the chart with the patient's or guardian's permission.  Estefana RAMAN Kiauna Zywicki, DO

## 2024-03-19 ENCOUNTER — Ambulatory Visit: Admitting: Plastic Surgery

## 2024-04-05 ENCOUNTER — Telehealth: Payer: Self-pay

## 2024-04-05 NOTE — Telephone Encounter (Signed)
 Opened in error

## 2024-06-15 ENCOUNTER — Ambulatory Visit: Admitting: Plastic Surgery
